# Patient Record
Sex: Female | Born: 1960
Health system: Southern US, Community
[De-identification: ages and names within clinical notes are randomized; demographics above are authoritative.]

## PROBLEM LIST (undated history)

## (undated) DIAGNOSIS — R112 Nausea with vomiting, unspecified: Secondary | ICD-10-CM

## (undated) DIAGNOSIS — R42 Dizziness and giddiness: Secondary | ICD-10-CM

## (undated) DIAGNOSIS — K649 Unspecified hemorrhoids: Secondary | ICD-10-CM

## (undated) DIAGNOSIS — I10 Essential (primary) hypertension: Secondary | ICD-10-CM

## (undated) DIAGNOSIS — L80 Vitiligo: Secondary | ICD-10-CM

## (undated) DIAGNOSIS — Z9889 Other specified postprocedural states: Secondary | ICD-10-CM

## (undated) DIAGNOSIS — F419 Anxiety disorder, unspecified: Secondary | ICD-10-CM

## (undated) DIAGNOSIS — E7800 Pure hypercholesterolemia, unspecified: Secondary | ICD-10-CM

## (undated) DIAGNOSIS — N920 Excessive and frequent menstruation with regular cycle: Secondary | ICD-10-CM

## (undated) HISTORY — DX: Vitiligo: L80

## (undated) HISTORY — PX: APPENDECTOMY: SHX54

## (undated) HISTORY — DX: Excessive and frequent menstruation with regular cycle: N92.0

## (undated) HISTORY — DX: Unspecified hemorrhoids: K64.9

## (undated) HISTORY — PX: TUBAL LIGATION: SHX77

## (undated) HISTORY — PX: ENDOMETRIAL ABLATION: SHX621

## (undated) HISTORY — PX: COLONOSCOPY: SHX174

---

## 2002-01-19 ENCOUNTER — Ambulatory Visit (HOSPITAL_COMMUNITY): Admission: RE | Admit: 2002-01-19 | Discharge: 2002-01-19 | Payer: Self-pay | Admitting: Obstetrics and Gynecology

## 2002-01-19 ENCOUNTER — Other Ambulatory Visit: Admission: RE | Admit: 2002-01-19 | Discharge: 2002-01-19 | Payer: Self-pay | Admitting: Obstetrics and Gynecology

## 2002-01-19 ENCOUNTER — Encounter: Payer: Self-pay | Admitting: Obstetrics and Gynecology

## 2002-01-26 ENCOUNTER — Ambulatory Visit (HOSPITAL_COMMUNITY): Admission: RE | Admit: 2002-01-26 | Discharge: 2002-01-26 | Payer: Self-pay | Admitting: Obstetrics and Gynecology

## 2003-12-26 ENCOUNTER — Ambulatory Visit (HOSPITAL_COMMUNITY): Admission: RE | Admit: 2003-12-26 | Discharge: 2003-12-26 | Payer: Self-pay | Admitting: Obstetrics & Gynecology

## 2004-06-25 ENCOUNTER — Ambulatory Visit (HOSPITAL_COMMUNITY): Admission: RE | Admit: 2004-06-25 | Discharge: 2004-06-25 | Payer: Self-pay | Admitting: Family Medicine

## 2009-08-03 ENCOUNTER — Encounter: Payer: Self-pay | Admitting: Emergency Medicine

## 2009-08-04 ENCOUNTER — Inpatient Hospital Stay (HOSPITAL_COMMUNITY): Admission: EM | Admit: 2009-08-04 | Discharge: 2009-08-05 | Payer: Self-pay | Admitting: Emergency Medicine

## 2010-03-24 ENCOUNTER — Ambulatory Visit: Payer: Self-pay | Admitting: Gastroenterology

## 2010-04-01 ENCOUNTER — Ambulatory Visit (HOSPITAL_COMMUNITY): Admission: RE | Admit: 2010-04-01 | Discharge: 2010-04-01 | Payer: Self-pay | Admitting: Gastroenterology

## 2010-04-01 ENCOUNTER — Ambulatory Visit: Payer: Self-pay | Admitting: Gastroenterology

## 2011-01-12 NOTE — Assessment & Plan Note (Signed)
Summary: FAMILY HISTORY OF COLON CA AND POLYPS   Visit Type:  Initial Consult Referring Provider:  [facility]ough Primary Care Provider:  Regino Schultze, M.D.  Chief Complaint:  TCS/hx hemorrhoids.  History of Present Illness: No bleeding, change in bowel habits, or constipation. Sister had advanced polyp. Mother had colon CA.  Preventive Screening-Counseling & Management  Alcohol-Tobacco     Smoking Status: quit  Current Medications (verified): 1)  Prinzide 10-12.5 Mg Tabs (Lisinopril-Hydrochlorothiazide) .... Take 1 Tablet By Mouth Once A Day 2)  Ibuprofen .... As Needed 3)  Zyrtec Hives Relief 10 Mg Tabs (Cetirizine Hcl) .... As Needed  Allergies (verified): No Known Drug Allergies  Past History:  Past Medical History: Allergies Hypertension  Past Surgical History: Tubal Ligation Endometrial Ablation  Family History: FH of Colon Cancer: mother, 39 yo Multiple Polyps: Sister: C. 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 Alcohol Use - yes: 1-2x/mo, beer.  Smoking Status:  quit  Review of Systems       Per HPI otherwise all systems negative.  Vital Signs:  Patient profile:   50 year old female Height:      62 inches Weight:      203 pounds BMI:     37.26 Temp:     98.9 degrees F oral Pulse rate:   72 / minute BP sitting:   130 / 80  (left arm) Cuff size:   regular  Vitals Entered By: Cloria Spring LPN (March 24, 2010 2:57 PM)  Physical Exam  General:  Well developed, well nourished, no acute distress. Head:  Normocephalic and atraumatic. Eyes:  PERRLA, no icterus. Mouth:  No deformity or lesions. Neck:  Supple; no masses. Lungs:  Clear throughout to auscultation. Heart:  Regular rate and rhythm; no murmurs. Abdomen:  Soft, nontender and nondistended.  Normal bowel  sounds. Extremities:  No cyanosis, edema or deformities noted. Neurologic:  Alert and  oriented x4;  grossly normal neurologically.  Impression & Recommendations:  Problem # 1:  NEOPLASM, MALIGNANT, COLON, FAMILY HX, MOTHER (ICD-V16.0) Assessment Unchanged and has a sister with multiple simple adenomas and she is above average risk for developing colon cancer. TCS next WED-SUPREP. OPV as needed. Will decide subsequent TCS after WED.  CC: PCP     Appended Document: Orders Update    Clinical Lists Changes  Orders: Added new Service order of Consultation Level I [phone]) - Signed

## 2011-01-12 NOTE — Letter (Signed)
Summary: TCS ORDER  TCS ORDER   Imported By: Ave Filter 03/24/2010 15:46:02  _____________________________________________________________________  External Attachment:    Type:   Image     Comment:   External Document

## 2011-03-20 LAB — HEPATIC FUNCTION PANEL
ALT: 16 U/L (ref 0–35)
ALT: 27 U/L (ref 0–35)
AST: 20 U/L (ref 0–37)
AST: 21 U/L (ref 0–37)
Albumin: 3.1 g/dL — ABNORMAL LOW (ref 3.5–5.2)
Albumin: 3.3 g/dL — ABNORMAL LOW (ref 3.5–5.2)
Alkaline Phosphatase: 50 U/L (ref 39–117)
Alkaline Phosphatase: 51 U/L (ref 39–117)
Bilirubin, Direct: 0.1 mg/dL (ref 0.0–0.3)
Bilirubin, Direct: 0.1 mg/dL (ref 0.0–0.3)
Indirect Bilirubin: 0.8 mg/dL (ref 0.3–0.9)
Indirect Bilirubin: 0.9 mg/dL (ref 0.3–0.9)
Total Bilirubin: 0.9 mg/dL (ref 0.3–1.2)
Total Bilirubin: 1 mg/dL (ref 0.3–1.2)
Total Protein: 5.8 g/dL — ABNORMAL LOW (ref 6.0–8.3)
Total Protein: 6.3 g/dL (ref 6.0–8.3)

## 2011-03-20 LAB — BASIC METABOLIC PANEL
BUN: 10 mg/dL (ref 6–23)
CO2: 20 mEq/L (ref 19–32)
Calcium: 9 mg/dL (ref 8.4–10.5)
Chloride: 107 mEq/L (ref 96–112)
Creatinine, Ser: 0.82 mg/dL (ref 0.4–1.2)
GFR calc Af Amer: >60 mL/min (ref >60)
GFR calc non Af Amer: >60 mL/min (ref >60)
Glucose, Bld: 133 mg/dL — ABNORMAL HIGH (ref 70–99)
Potassium: 3.8 mEq/L (ref 3.5–5.1)
Sodium: 136 mEq/L (ref 135–145)

## 2011-03-20 LAB — POCT CARDIAC MARKERS
CKMB, poc: 1 ng/mL (ref 1.0–8.0)
Myoglobin, poc: 43.8 ng/mL (ref 12–200)
Troponin i, poc: <0.05 ng/mL (ref 0.00–0.09)

## 2011-03-20 LAB — LIPID PANEL
Cholesterol: 209 mg/dL — ABNORMAL HIGH (ref 0–200)
HDL: 61 mg/dL (ref >39)
LDL Cholesterol: 139 mg/dL — ABNORMAL HIGH (ref 0–99)
Total CHOL/HDL Ratio: 3.4 RATIO
Triglycerides: 46 mg/dL (ref <150)
VLDL: 9 mg/dL (ref 0–40)

## 2011-03-20 LAB — DIFFERENTIAL
Basophils Absolute: 0.1 10*3/uL (ref 0.0–0.1)
Basophils Relative: 1 % (ref 0–1)
Eosinophils Absolute: 0.1 10*3/uL (ref 0.0–0.7)
Eosinophils Relative: 1 % (ref 0–5)
Lymphocytes Relative: 14 % (ref 12–46)
Lymphs Abs: 1.4 10*3/uL (ref 0.7–4.0)
Monocytes Absolute: 0.4 10*3/uL (ref 0.1–1.0)
Monocytes Relative: 4 % (ref 3–12)
Neutro Abs: 8 10*3/uL — ABNORMAL HIGH (ref 1.7–7.7)
Neutrophils Relative %: 80 % — ABNORMAL HIGH (ref 43–77)

## 2011-03-20 LAB — CBC
HCT: 39.4 % (ref 36.0–46.0)
Hemoglobin: 14.1 g/dL (ref 12.0–15.0)
MCHC: 35.6 g/dL (ref 30.0–36.0)
MCV: 85.3 fL (ref 78.0–100.0)
Platelets: 247 10*3/uL (ref 150–400)
RBC: 4.63 MIL/uL (ref 3.87–5.11)
RDW: 13.3 % (ref 11.5–15.5)
WBC: 10 10*3/uL (ref 4.0–10.5)

## 2011-03-20 LAB — URINALYSIS, ROUTINE W REFLEX MICROSCOPIC
Bilirubin Urine: NEGATIVE
Glucose, UA: NEGATIVE mg/dL
Hgb urine dipstick: NEGATIVE
Ketones, ur: 40 mg/dL — AB
Nitrite: NEGATIVE
Protein, ur: NEGATIVE mg/dL
Specific Gravity, Urine: 1.02 (ref 1.005–1.030)
Urobilinogen, UA: 0.2 mg/dL (ref 0.0–1.0)
pH: 7 (ref 5.0–8.0)

## 2011-03-20 LAB — T4, FREE: Free T4: 0.9 ng/dL (ref 0.80–1.80)

## 2011-03-20 LAB — HEMOGLOBIN AND HEMATOCRIT, BLOOD
HCT: 39.1 % (ref 36.0–46.0)
Hemoglobin: 13.6 g/dL (ref 12.0–15.0)

## 2011-03-20 LAB — TSH: TSH: 2.23 u[IU]/mL (ref 0.350–4.500)

## 2011-03-20 LAB — HOMOCYSTEINE: Homocysteine: 6.9 umol/L (ref 4.0–15.4)

## 2011-04-27 NOTE — Discharge Summary (Signed)
Emma Pittman, Emma Pittman              ACCOUNT NO.:  [id number]   MEDICAL RECORD NO.:  [id number]           PATIENT TYPE:   LOCATION:                                 FACILITY:   PHYSICIAN:  Beckey Rutter, MD  DATE OF BIRTH:  [birth year]   DATE OF ADMISSION:  DATE OF DISCHARGE:                               DISCHARGE SUMMARY   PRIMARY CARE PHYSICIAN:  Kirk Ruths, MD   CHIEF COMPLAINT:  Vertigo and nausea.   BRIEF HISTORY OF PRESENT ILLNESS:  A 50 year old pleasant Caucasian  female presented with above complaint.   HOSPITAL CONSULTATION:  The patient was seen in consultation by Dr. Newman Pies.   HOSPITAL PROCEDURES:  1. CT head without contrast on August 03, 2009; impression is no acute      intracranial abnormality.  2. MRI on August 03, 2009; impression is no acute or reversible      process, possible mild sinus inflammation.   DISCHARGE DIAGNOSES:  1. Right-sided labyrinthitis.  2. Obesity.  3. Remote history of dysfunctional uterine bleeding.  4. Status post tubal ligation.   DISCHARGE MEDICATIONS:  The patient will be discharged on:  1. Valium 2 mg p.o. q.8 h. p.r.n.  2. Doxycycline 100 mg p.o. b.i.d.  3. Meclizine 25 mg p.o. t.i.d.  4. Senokot 1 tablet p.o. at night, hold for loose stools.  5. Tylenol p.r.n. 650 mg p.r.n.      Beckey Rutter, MD  Electronically Signed     EME/MEDQ  D:  08/05/2009  T:  08/06/2009  Job:  [id number]   cc:   Kirk Ruths, M.D.

## 2011-04-27 NOTE — Consult Note (Signed)
Emma Pittman, Emma Pittman              ACCOUNT NO.:  [id number]   MEDICAL RECORD NO.:  [id number]          PATIENT TYPE:  INP   LOCATION:  4733                         FACILITY:  [facility]   PHYSICIAN:  Newman Pies, MD            DATE OF BIRTH:  07-10-1961   DATE OF CONSULTATION:  08/03/2009  DATE OF DISCHARGE:                                 CONSULTATION   CHIEF COMPLAINT:  Severe dizziness.   HISTORY OF PRESENT ILLNESS:  The patient is a 50 year old white female  who was transferred from the [facility] today for further  evaluation and treatment of her severe dizziness.  According to the  patient, she had an acute onset of dizziness earlier this morning.  She  describes the dizziness as true spinning vertigo sensation, with a  surrounding moving independent of her body.  The dizziness sensation is  constant.  It is mildly relieved by closing her eyes and staying in a  still position.  She also complains of a pressure sensation in the right  ear.  She denies any significant otalgia, otorrhea, or tinnitus.  She  has no previous history of otitis media, otitis external, otologic  surgery.  She did have an episode of upper respiratory infection  approximately 3 weeks ago.  She denies any previous history of migraine  headache or head trauma.  She also denies any recent barotrauma.  She  denies any significant loud noise exposure.   PAST MEDICAL HISTORY:  None.   PAST SURGICAL HISTORY:  Appendectomy and bilateral tubal ligation.   HOME MEDICATIONS:  None.   ALLERGIES:  No known drug allergies.   SOCIAL HISTORY:  Nonsmoker, no drug abuse, occasional drinker.   FAMILY HISTORY:  The patient denies any family history of otologic or  neurologic disorders.   RADIOLOGY:  The patient's head CT scan and MRI were all negative for any  significant intracranial abnormality.   PHYSICAL EXAMINATION:  VITAL SIGNS:  Temperature 98.6, blood pressure  153/96, pulse 96, respiration 17, oxygen  saturation 100% on room air.  GENERAL:  The patient is a well-nourished and well-developed 50 year old  white female in no acute distress.  She is alert and oriented x3.  HEENT:  Her pupils are equal, round, reactive to light.  Extraocular  motion is intact.  The patient is noted to have torsional nystagmus with  a rightward gaze.  No significant nystagmus is noted toward the left  side.  Palpation on percussion of the face reveals no sinus tenderness.  Examination of the ears shows normal auricles, external auditory canals,  and tympanic membranes bilaterally.  No middle ear effusion is noted.  Weber tuning fork test lateralizes to the left.  Air conduction is  greater than bone conduction bilaterally.  Nasal examination shows  normal mucosa, septum, and turbinates.  Oral cavity examination shows  normal lips, gums, tongue, oral cavity, and oropharyngeal mucosa.  Palpation on the neck reveals no lymphadenopathy or mass.  The trachea  is midline.  The thyroid is not significantly enlarged.  Cranial nerves  II through  XII are all grossly intact.  Cerebellar examination is  unremarkable.  The Dix-Hallpike is negative.  However, the patient  complains of dizziness at all positions.   IMPRESSION:  1. The patient's history and physical exam findings are suggestive of      right-sided labyrinthitis.  She appears to have mild hearing loss      on the right side.  2. The rest of her neurologic evaluation are unremarkable.  The head      CT and MRI scans are all negative.   RECOMMENDATIONS:  1. The patient will be admitted to the [facility] hospitalist service      for IV hydration.  2. Symptomatic control with Ativan and meclizine p.r.n.  3. The patient's symptoms will likely improve with time over the      coming weeks.  4. The patient will follow up in my office in approximately 1 week.      She is encouraged to call if any questions or concerns.      Newman Pies, MD  Electronically  Signed     ST/MEDQ  D:  08/03/2009  T:  08/04/2009  Job:  [id number]

## 2011-04-27 NOTE — H&P (Signed)
NAMEJESSLY, LEBECK NO.:  [id number]   MEDICAL RECORD NO.:  [id number]          PATIENT TYPE:  EMS   LOCATION:  MAJO                         FACILITY:  [facility]   PHYSICIAN:  Vania Rea, M.D. DATE OF BIRTH:  [birth year]   DATE OF ADMISSION:  08/03/2009  DATE OF DISCHARGE:                              HISTORY & PHYSICAL   PRIMARY CARE PHYSICIAN:  Dr. Karleen Hampshire.   CHIEF COMPLAINT:  Vertigo and nausea since this morning.   HISTORY OF PRESENT ILLNESS:  This is a 50 year old obese Caucasian lady  with no significant past medical problems who considered herself to be  in good health until this morning after breakfast when she had sudden  onset of vertigo with the place spinning, associated with nausea.  The  patient's symptoms were not relieved by rest and eventually she  presented to [facility] emergency room where she got some relief from  intravenous Valium, she says because it help her to lie still and she  was not moving.  The patient had a CT scan in the emergency room at  [facility] and was then transferred to [facility] emergency room to get  an MRI done.  The MRI was negative.  The patient continues to have  nausea triggered by head movement and photophobia and the hospitalist  service was called to assist with management.   The patient denies any fever, headaches, neck stiffness.  She denies any  cough, cold, chest pains or shortness of breath.  She denies any lower  extremity edema or pain.   PAST MEDICAL HISTORY:  1. Remote history of dysfunctional uterine bleeding.  2. Status post tubal ligation.   MEDICATIONS:  None.   ALLERGIES:  None.   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 She is married with one 39 year old daughter.   FAMILY HISTORY:  She does not drink or smoke.  She does drink heavily at  weekends occasionally. On this weekend she drank a 12-pack of beer.   FAMILY HISTORY:  Significant  for father who died. He did have coronary  artery disease, diabetes and hypertension.  Her mother is also deceased.  She had hypertension and diabetes and she had a sibling who is also  deceased with coronary artery disease, hypertension and probable stroke.   REVIEW OF SYSTEMS:  Other than noted above was unremarkable.   PHYSICAL EXAMINATION:  VITAL SIGNS:  She is 5 feet 2 inches in height,  but she denies knowing her weight. Temperature is 97.8, pulse 101,  respiratory rate 20, blood pressure 153/83, she is saturating 100% on  room air. She is in no pain.  GENERAL:  A young very pleasant but obviously obese Caucasian lady lying  in the stretcher, complaining of the lights in her eyes.  HEENT:  Her pupils are round equal and reactive.  Mucous membranes pink,  anicteric.  No cervical lymphadenopathy or thyromegaly.  No jugular  venous distention.  No carotid bruit.  CHEST:  Clear to auscultation bilaterally.  CARDIOVASCULAR:  Regular rhythm.  No murmur.  ABDOMEN:  Obese, soft and nontender.  EXTREMITIES:  Without edema.  She has no calf tenderness.  No bony joint  deformities.  Dorsalis pedis pulses 2+ equal bilaterally.  SKIN:  Warm and dry.  There is no ulceration.  NEUROLOGIC:  Notable for nystagmus with upward and rightward gaze. No  nystagmus with leftward gaze. Other than this cranial nerves II-XII are  grossly intact.  Motor and sensory and coordination systems as assessed  in the upper and lower extremities were completely normal.   LABORATORY DATA:  White count is 10.0, hemoglobin 14.1, platelets  247,000. She has 80% neutrophils.  Absolute neutrophil count is 80.  Sodium is 136, potassium 3.8, chloride 107, CO2 20, glucose 133, BUN 10,  creatinine 0.8, calcium 9.0. Cardiac enzymes:  Troponin was  undetectable, myoglobin was 43.   As noted above, CT scan was negative and a preliminary report from her  MRI was no acute findings.   ASSESSMENT:  1. Vertigo with right-sided  nystagmus at rest.  2. Obesity.   PLAN:  Will admit this lady with a presumed diagnosis of positional  vertigo but will get an ear, nose and throat consult for further  evaluation. Will treat with intravenous Valium, oral meclizine, and  intravenous hydration.   ADDENDUM:  The patient had the benefit of an ear, nose and throat  consult while in the emergency room and was diagnosed with a right-sided  labyrinthitis. Recommendations are to admit with the same treatment as  recommended above.  Other plans as per orders.      Vania Rea, M.D.  Electronically Signed     LC/MEDQ  D:  08/03/2009  T:  08/03/2009  Job:  [id number]   cc:   Pollyann Savoy, MD  Newman Pies, MD  Kirk Ruths, M.D.

## 2011-04-30 NOTE — Op Note (Signed)
[facility]  Patient:    Emma Pittman, Emma Pittman Visit Number: [id number] MRN: [id number]          Service Type: DSU Location: DAY Attending Physician:  Tilda Burrow Dictated by:   Christin Bach, M.D. Admit Date:  01/26/2002 Discharge Date: 01/26/2002                             Operative Report  PREOPERATIVE DIAGNOSIS:  Acute pelvic pain, suspected left hydrosalpinx.  POSTOPERATIVE DIAGNOSIS: 1. Acute pelvic pain, suspected left hydrosalpinx. 2. Small bowel adhesions, right lower quadrant.  PROCEDURE: 1. Diagnostic laparoscopy. 2. Left salpingectomy. 3. Lysis of small bowel adhesions.  SURGEON:  Christin Bach, M.D.  ASSISTANT:  _____ .  ANESTHESIA:  General.  COMPLICATIONS:  None.  FINDINGS: 1. Abnormal-appearing right epiglottis and right arytenoid with patient and    husband advised to seek ENT evaluation at a later date for these atypical    findings. 2. Filmy adhesions in the left adnexa consistent with hydrosalpinx. 4. Small bowel adhesions to the right adnexa further documented and excised.  DESCRIPTION OF PROCEDURE:  The patient was taken to the operating room, prepped and draped in the usual fashion for combined abdominal and vaginal procedure with legs supported in the _____ supports, Hulka tenaculum attached to the cervix, Foley catheter in place.  An infraumbilical vertical 1 cm skin incision performed with introduction of a 10 mm laparoscopic trocar. Introduction laparoscope revealed normal bowel and pelvic anatomy over the surfaces and no evidence of bleeding.  Suprapubic 12 mm trocar was placed without difficulty and then the third trocar placed in the left lower quadrant.  The patient had inspection of the pelvis and revealed a smooth, mobile uterus without abnormalities.  The right adnexa showed a visibly normal tube and ovary on that side.  The left adnexa was densely adherent to the sidewall with thin, filmy adhesions  as documented in laboratory photos.  The left tube was a hydrosalpinx.  Once the thin filmy adhesions were cut free and the left ovary looked grossly normal, we decided to take the left tube and leave the left ovary.  We then proceeded to use unipolar cautery as the surgical tool to cut beneath the tube and remove it from the left ovary.  The ovary was made mobile on all sides.  We then also used sharp dissection to free up the small bowel adhesions to the right lower quadrant.  Pelvis was irrigated.  The small bowel and adhesions were photo documented as being normal without any evidence of bowel injury.  We then proceeded to inflate the abdomen leaving in a generous 2 cc or so of saline solution followed by removal of laparoscopic equipment, 0 Vicryl closure of the fascial level with suprapubic and umbilical sites and staple closure of all incisions.  The patient tolerated the procedure well and went to the recovery room in good condition. Dictated by:   Christin Bach, M.D. Attending Physician:  Tilda Burrow DD:  03/05/02 TD:  03/07/02 Job: [id number]

## 2011-04-30 NOTE — H&P (Signed)
[facility]  Patient:    Emma Pittman, Emma Pittman Visit Number: [id number] MRN: [id number]          Service Type: OUT Location: RAD Attending Physician:  Tilda Burrow Dictated by:   Christin Bach, M.D. Proc. Date: 01/26/01 Admit Date:  01/19/2002 Discharge Date: 01/19/2002   CC:         [facility]   History and Physical  DATE OF BIRTH:  February 06, 1961  ADMITTING DIAGNOSES:  Left lower quadrant pain, suspected left hydrosalpinx, scheduled for diagnostic laparoscopy.  HISTORY OF PRESENT ILLNESS:  This 50 year old gravida 1, para 1 who is status post tubal ligation with LMP January 11, 2002 is admitted at this time after being seen twice in our office during February for left lower quadrant pain. When seen January 19, 2002 [name] reported a three week history of left-sided pain of gradual onset with persistent tenderness in the left lower quadrant not associated with bowel function.  She is having normal bowel activity and urinating without difficulty.  She has no fever.  No discharge.  Pain occurs with intercourse as well as other times.  There has been no masses or fullness noted.  Ultrasound has been ordered at [facility] which reveals normal ovaries on both right and left side with a tubular structure in the left adnexa most consistent with hydrosalpinx.  Examination shows tenderness in the adnexa.  Laparoscopy is planned to confirm the diagnosis of hydrosalpinx and to excise it if noted.  Patient is aware that conditions such as adhesions or bleeding might require removal of the left ovary as well. Patient requests that the right adnexa be inspected as well.  If salpingectomy is deemed as beneficial on the right side, then this has been discussed as a possibility and the patient wants this done if we suspect there is any possibility that the same problem could reoccur on the opposite side.  PAST MEDICAL HISTORY:  Notable  for an episode of left flank pain August 18, 2000 requiring overnight observation at which time the patient had a pelvic ultrasound which did not identify any hydrosalpinx at the time of that hospitalization.  At the time of that hospitalization for left lower quadrant pain she had some loose diarrhea that day without vomiting that improved dramatically over a four hour time frame.  PAST SURGICAL HISTORY:  Tubal ligation January 17, 1997.  ALLERGIES:  No known drug allergies.  SOCIAL HISTORY:  Habits:  Cigarettes:  None.  Alcohol:  Occasional social.  PHYSICAL EXAMINATION  VITAL SIGNS:  Height 5 feet 6 inches, weight 192 pounds, blood pressure 140/75.  GENERAL:  Healthy, moderately large framed Caucasian female who appears in mild to moderate discomfort from left lower quadrant.  HEENT:  Pupils equal, round, reactive.  NECK:  Supple.  CARDIOVASCULAR:  Unremarkable.  ABDOMEN:  Well healed surgical scar status post laparoscopy.  EXTREMITIES:  Grossly normal.  PELVIC:  External genitalia:  Normal.  Vaginal examination:  Normal secretions.  Cervix and uterus:  Normal to palpation.  Left adnexa:  2+/4+ tender without any appreciable masses.  PLAN:  Diagnostic laparoscopy, probable left salpingectomy and other procedures as deemed necessary to be performed January 26, 2002. Dictated by:   Christin Bach, M.D. Attending Physician:  Tilda Burrow DD:  01/23/02 TD:  01/23/02 Job: [id number]

## 2011-04-30 NOTE — Op Note (Signed)
Emma Pittman, Emma Pittman                        ACCOUNT NO.:  [id number]   MEDICAL RECORD NO.:  [id number]                   PATIENT TYPE:  AMB   LOCATION:  DAY                                  FACILITY:  [facility]   PHYSICIAN:  Lazaro Arms, M.D.                DATE OF BIRTH:  14-Mar-1961   DATE OF PROCEDURE:  12/26/2003  DATE OF DISCHARGE:                                 OPERATIVE REPORT   PREOPERATIVE DIAGNOSES:  1. Menometrorrhagia.  2. Dysmenorrhea.   POSTOPERATIVE DIAGNOSES:  1. Menometrorrhagia.  2. Dysmenorrhea.   PROCEDURE:  Hysteroscopy dilatation and curettage with endometrial ablation.   SURGEON:  Lazaro Arms, M.D.   ANESTHESIA:  Laryngeal mask anesthesia.   FINDINGS:  The patient had a normal endometrial cavity.  No polyps and no  abnormalities whatsoever.  It was quite small.  As well it only took 11 to  12 cc of fluid for appropriate pressure.   DESCRIPTION OF OPERATION:  The patient was taken to the operating room and  placed in the supine position where she underwent laryngeal mask anesthesia.  She was placed in the low lithotomy position and prepped and draped in the  usual sterile fashion; the bladder was drained.  A Graves speculum was  placed.  The cervix was grasped with a single-tooth tenaculum.  Marcaine  1/2% was injected as a paracervical block, 10 cc on either side.   The cervix was dilated serially to allow passage of the hysteroscope. The  hysteroscope was placed without difficulty and saline was used for  distending media.  There were no abnormalities of the endometrium. A  vigorous curettage was performed in all areas; a good uterine cry in all  areas.   The ThermaChoice endometrial ablation balloon was placed. It required 12 cc  of fluid to get an adequate pressure and it maintained pressure throughout  approximately 200 mmHg.  The patient tolerated the procedure well.  The  total therapy time was 9 minutes and 7 seconds. The balloon was  deflated;  all the fluid was accounted for.  She was awakened from anesthesia taken to  the recovery room in good stable condition. All counts were correct. There  was minimal blood loss.     ___________________________________________                                            Lazaro Arms, M.D.   Loraine Maple  D:  12/26/2003  T:  12/26/2003  Job:  [id number]

## 2011-04-30 NOTE — H&P (Signed)
NAME:  Emma Pittman, Emma Pittman                        ACCOUNT NO.:  [id number]   MEDICAL RECORD NO.:  [id number]                   PATIENT TYPE:  AMB   LOCATION:  DAY                                  FACILITY:  [facility]   PHYSICIAN:  Lazaro Arms, M.D.                DATE OF BIRTH:  1961/01/20   DATE OF ADMISSION:  DATE OF DISCHARGE:                                HISTORY & PHYSICAL   HISTORY OF PRESENT ILLNESS:  Emma Pittman is a 50 year old white female, gravida  1, para 1, status post tubal ligation and status post diagnostic lap with  left salpingectomy who has been having increasing difficulty with menstrual  periods now for quite some time.  The patient despite having a tubal  ligation has been tried on multiple birth control pills to try to control  her periods without success.  Additionally her cramping with her menstrual  periods is significant as well.  The patient has responded nicely to oral  Megace but that has been the only thing that has stopped her almost  continuous bleeding and pain.  As a result, she is admitted for  hysteroscopy, D&C and endometrial ablation.   PAST MEDICAL HISTORY:  Negative.   PAST SURGICAL HISTORY:  She had a tubal ligation, she also had a laparoscopy  with a left salpingectomy for a left hydrosalpinx and some small bowel  adhesions in 2003.   PAST OB:  Vaginal delivery.   REVIEW OF SYMPTOMS:  Otherwise negative.   PHYSICAL EXAMINATION:  HEENT:  Unremarkable.  Thyroid is normal.  LUNGS:  Clear.  HEART:  Regular rhythm without murmur, regurg or gallop.  BREASTS:  Without mass, discharge or skin changes.  ABDOMEN:  Benign.  PELVIC:  Normal.  EXTREMITIES:  Warm with no edema.  NEUROLOGIC:  Grossly intact.   IMPRESSION:  1. Continued dysfunctional uterine bleeding unresponsive to oral     contraceptives.  2. Dysmenorrhea.   PLAN:  The patient is admitted for hysteroscopy, D&C and endometrial  ablation.  She understands the risks, benefits,  indications and alternatives  and will proceed.    ___________________________________________                                         Lazaro Arms, M.D.   Loraine Maple  D:  12/25/2003  T:  12/25/2003  Job:  [id number]

## 2011-08-12 ENCOUNTER — Encounter (HOSPITAL_COMMUNITY): Payer: Self-pay

## 2011-08-12 ENCOUNTER — Encounter (HOSPITAL_COMMUNITY)
Admission: RE | Admit: 2011-08-12 | Discharge: 2011-08-12 | Disposition: A | Discharge status: Home / Self Care | Payer: 59 | Source: Ambulatory Visit | Attending: Ophthalmology | Admitting: Ophthalmology

## 2011-08-12 ENCOUNTER — Other Ambulatory Visit: Payer: Self-pay

## 2011-08-12 HISTORY — DX: Essential (primary) hypertension: I10

## 2011-08-12 HISTORY — DX: Dizziness and giddiness: R42

## 2011-08-12 HISTORY — DX: Nausea with vomiting, unspecified: R11.2

## 2011-08-12 HISTORY — DX: Pure hypercholesterolemia, unspecified: E78.00

## 2011-08-12 HISTORY — DX: Other specified postprocedural states: Z98.890

## 2011-08-12 LAB — CBC
HCT: 39.8 % (ref 36.0–46.0)
Hemoglobin: 13.5 g/dL (ref 12.0–15.0)
MCH: 29.2 pg (ref 26.0–34.0)
MCHC: 33.9 g/dL (ref 30.0–36.0)
MCV: 86.1 fL (ref 78.0–100.0)
Platelets: 257 10*3/uL (ref 150–400)
RBC: 4.62 MIL/uL (ref 3.87–5.11)
RDW: 12.8 % (ref 11.5–15.5)
WBC: 6.4 10*3/uL (ref 4.0–10.5)

## 2011-08-12 LAB — BASIC METABOLIC PANEL
BUN: 14 mg/dL (ref 6–23)
CO2: 28 mEq/L (ref 19–32)
Calcium: 9.6 mg/dL (ref 8.4–10.5)
Chloride: 100 mEq/L (ref 96–112)
Creatinine, Ser: 0.54 mg/dL (ref 0.50–1.10)
GFR calc Af Amer: >60 mL/min (ref >60)
GFR calc non Af Amer: >60 mL/min (ref >60)
Glucose, Bld: 93 mg/dL (ref 70–99)
Potassium: 3.5 mEq/L (ref 3.5–5.1)
Sodium: 137 mEq/L (ref 135–145)

## 2011-08-12 NOTE — Patient Instructions (Addendum)
22 Emma Pittman  08/12/2011   Your procedure is scheduled on:  08/19/2011  Report to [facility] at  130 PM.  Call this number if you have problems the morning of surgery: [phone]   Remember:   Do not eat food:After Midnight.  Do not drink clear liquids: After Midnight.  Take these medicines the morning of surgery with A SIP OF WATER: Lisinopril   Do not wear jewelry, make-up or nail polish.  Do not wear lotions, powders, or perfumes. You may wear deodorant.  Do not shave 48 hours prior to surgery.  Do not bring valuables to the hospital.  Contacts, dentures or bridgework may not be worn into surgery.  Leave suitcase in the car. After surgery it may be brought to your room.  For patients admitted to the hospital, checkout time is 11:00 AM the day of discharge.   Patients discharged the day of surgery will not be allowed to drive home.  Name and phone number of your driver: family  Special Instructions: N/A   Please read over the following fact sheets that you were given: Pain Booklet, Surgical Site Infection Prevention, Anesthesia Post-op Instructions and Care and Recovery After Surgery PATIENT INSTRUCTIONS POST-ANESTHESIA  IMMEDIATELY FOLLOWING SURGERY:  Do not drive or operate machinery for the first twenty four hours after surgery.  Do not make any important decisions for twenty four hours after surgery or while taking narcotic pain medications or sedatives.  If you develop intractable nausea and vomiting or a severe headache please notify your doctor immediately.  FOLLOW-UP:  Please make an appointment with your surgeon as instructed. You do not need to follow up with anesthesia unless specifically instructed to do so.  WOUND CARE INSTRUCTIONS (if applicable):  Keep a dry clean dressing on the anesthesia/puncture wound site if there is drainage.  Once the wound has quit draining you may leave it open to air.  Generally you should leave the bandage intact for twenty four hours  unless there is drainage.  If the epidural site drains for more than 36-48 hours please call the anesthesia department.  QUESTIONS?:  Please feel free to call your physician or the hospital operator if you have any questions, and they will be happy to assist you.     [facility] Anesthesia Department [address] [place] [phone]

## 2011-08-19 ENCOUNTER — Encounter (HOSPITAL_COMMUNITY): Payer: Self-pay | Admitting: *Deleted

## 2011-08-19 ENCOUNTER — Encounter (HOSPITAL_COMMUNITY)
Admission: RE | Disposition: A | Discharge status: Home / Self Care | Payer: Self-pay | Source: Ambulatory Visit | Attending: Ophthalmology

## 2011-08-19 ENCOUNTER — Encounter (HOSPITAL_COMMUNITY): Payer: Self-pay | Admitting: Anesthesiology

## 2011-08-19 ENCOUNTER — Ambulatory Visit (HOSPITAL_COMMUNITY)
Admission: RE | Admit: 2011-08-19 | Discharge: 2011-08-19 | Disposition: A | Discharge status: Home / Self Care | Payer: 59 | Source: Ambulatory Visit | Attending: Ophthalmology | Admitting: Ophthalmology

## 2011-08-19 ENCOUNTER — Ambulatory Visit (HOSPITAL_COMMUNITY): Payer: 59 | Admitting: Anesthesiology

## 2011-08-19 DIAGNOSIS — H25049 Posterior subcapsular polar age-related cataract, unspecified eye: Secondary | ICD-10-CM | POA: Insufficient documentation

## 2011-08-19 DIAGNOSIS — Z01810 Encounter for preprocedural cardiovascular examination: Secondary | ICD-10-CM | POA: Insufficient documentation

## 2011-08-19 DIAGNOSIS — Z01812 Encounter for preprocedural laboratory examination: Secondary | ICD-10-CM | POA: Insufficient documentation

## 2011-08-19 HISTORY — PX: CATARACT EXTRACTION W/PHACO: SHX586

## 2011-08-19 SURGERY — PHACOEMULSIFICATION, CATARACT, WITH IOL INSERTION
Anesthesia: Monitor Anesthesia Care | Site: Eye | Laterality: Right | Wound class: Clean

## 2011-08-19 MED ORDER — LIDOCAINE HCL (PF) 1 % IJ SOLN
INTRAOCULAR | Status: DC | PRN
  Administered 2011-08-19: 14:00:00 via OPHTHALMIC

## 2011-08-19 MED ORDER — TETRACAINE HCL 0.5 % OP SOLN
1.0000 [drp] | OPHTHALMIC | Status: AC
  Administered 2011-08-19 (×3): 1 [drp] via OPHTHALMIC

## 2011-08-19 MED ORDER — PHENYLEPHRINE HCL 2.5 % OP SOLN
1.0000 [drp] | OPHTHALMIC | Status: AC
  Administered 2011-08-19 (×3): 1 [drp] via OPHTHALMIC

## 2011-08-19 MED ORDER — LIDOCAINE HCL 3.5 % OP GEL
OPHTHALMIC | Status: AC
  Administered 2011-08-19: 1 via OPHTHALMIC
  Filled 2011-08-19: qty 5

## 2011-08-19 MED ORDER — NEOMYCIN-POLYMYXIN-DEXAMETH 0.1 % OP OINT
TOPICAL_OINTMENT | OPHTHALMIC | Status: DC | PRN
  Administered 2011-08-19: 1 via OPHTHALMIC

## 2011-08-19 MED ORDER — NEOMYCIN-POLYMYXIN-DEXAMETH 3.5-10000-0.1 OP OINT
TOPICAL_OINTMENT | OPHTHALMIC | Status: AC
  Filled 2011-08-19: qty 3.5

## 2011-08-19 MED ORDER — TETRACAINE HCL 0.5 % OP SOLN
OPHTHALMIC | Status: AC
  Administered 2011-08-19: 1 [drp] via OPHTHALMIC
  Filled 2011-08-19: qty 2

## 2011-08-19 MED ORDER — PROVISC 10 MG/ML IO SOLN
INTRAOCULAR | Status: DC | PRN
  Administered 2011-08-19: 8.5 mg via OPHTHALMIC

## 2011-08-19 MED ORDER — MIDAZOLAM HCL 2 MG/2ML IJ SOLN
1.0000 mg | INTRAMUSCULAR | Status: DC | PRN
  Administered 2011-08-19: 2 mg via INTRAVENOUS

## 2011-08-19 MED ORDER — LACTATED RINGERS IV SOLN
INTRAVENOUS | Status: DC
  Administered 2011-08-19: 1000 mL via INTRAVENOUS

## 2011-08-19 MED ORDER — MIDAZOLAM HCL 5 MG/5ML IJ SOLN
INTRAMUSCULAR | Status: AC
  Administered 2011-08-19: 2 mg via INTRAVENOUS
  Filled 2011-08-19: qty 5

## 2011-08-19 MED ORDER — CYCLOPENTOLATE-PHENYLEPHRINE 0.2-1 % OP SOLN
1.0000 [drp] | OPHTHALMIC | Status: AC
  Administered 2011-08-19 (×3): 1 [drp] via OPHTHALMIC

## 2011-08-19 MED ORDER — PHENYLEPHRINE HCL 2.5 % OP SOLN
OPHTHALMIC | Status: AC
  Administered 2011-08-19: 1 [drp] via OPHTHALMIC
  Filled 2011-08-19: qty 2

## 2011-08-19 MED ORDER — LIDOCAINE HCL 3.5 % OP GEL
1.0000 "application " | Freq: Once | OPHTHALMIC | Status: AC
  Administered 2011-08-19: 1 via OPHTHALMIC

## 2011-08-19 MED ORDER — LIDOCAINE HCL 3.5 % OP GEL
OPHTHALMIC | Status: DC | PRN
  Administered 2011-08-19: 1 via OPHTHALMIC

## 2011-08-19 MED ORDER — EPINEPHRINE HCL 1 MG/ML IJ SOLN
INTRAMUSCULAR | Status: AC
  Filled 2011-08-19: qty 1

## 2011-08-19 MED ORDER — LIDOCAINE HCL (PF) 1 % IJ SOLN
INTRAMUSCULAR | Status: AC
  Filled 2011-08-19: qty 2

## 2011-08-19 MED ORDER — EPINEPHRINE HCL 1 MG/ML IJ SOLN
INTRAOCULAR | Status: DC | PRN
  Administered 2011-08-19: 14:00:00

## 2011-08-19 MED ORDER — CYCLOPENTOLATE-PHENYLEPHRINE 0.2-1 % OP SOLN
OPHTHALMIC | Status: AC
  Administered 2011-08-19: 1 [drp] via OPHTHALMIC
  Filled 2011-08-19: qty 2

## 2011-08-19 MED ORDER — BSS IO SOLN
INTRAOCULAR | Status: DC | PRN
  Administered 2011-08-19: 15 mL via OPHTHALMIC

## 2011-08-19 MED ORDER — POVIDONE-IODINE 5 % OP SOLN
OPHTHALMIC | Status: DC | PRN
  Administered 2011-08-19: 1 via OPHTHALMIC

## 2011-08-19 SURGICAL SUPPLY — 34 items
CAPSULAR TENSION RING-AMO (OPHTHALMIC RELATED) IMPLANT
CLOTH BEACON ORANGE TIMEOUT ST (SAFETY) ×2 IMPLANT
DUOVISC SYSTEM (INTRAOCULAR LENS)
EYE SHIELD UNIVERSAL CLEAR (GAUZE/BANDAGES/DRESSINGS) ×2 IMPLANT
GLOVE BIO SURGEON STRL SZ 6.5 (GLOVE) IMPLANT
GLOVE BIOGEL PI IND STRL 6.5 (GLOVE) ×1 IMPLANT
GLOVE BIOGEL PI IND STRL 7.0 (GLOVE) IMPLANT
GLOVE BIOGEL PI IND STRL 7.5 (GLOVE) IMPLANT
GLOVE BIOGEL PI INDICATOR 6.5 (GLOVE) ×1
GLOVE BIOGEL PI INDICATOR 7.0 (GLOVE)
GLOVE BIOGEL PI INDICATOR 7.5 (GLOVE)
GLOVE ECLIPSE 6.5 STRL STRAW (GLOVE) IMPLANT
GLOVE ECLIPSE 7.0 STRL STRAW (GLOVE) IMPLANT
GLOVE ECLIPSE 7.5 STRL STRAW (GLOVE) IMPLANT
GLOVE EXAM NITRILE LRG STRL (GLOVE) ×2 IMPLANT
GLOVE EXAM NITRILE MD LF STRL (GLOVE) IMPLANT
GLOVE SKINSENSE NS SZ6.5 (GLOVE)
GLOVE SKINSENSE NS SZ7.0 (GLOVE)
GLOVE SKINSENSE STRL SZ6.5 (GLOVE) IMPLANT
GLOVE SKINSENSE STRL SZ7.0 (GLOVE) IMPLANT
KIT VITRECTOMY (OPHTHALMIC RELATED) IMPLANT
PAD ARMBOARD 7.5X6 YLW CONV (MISCELLANEOUS) ×2 IMPLANT
PROC W NO LENS (INTRAOCULAR LENS)
PROC W SPEC LENS (INTRAOCULAR LENS)
PROCESS W NO LENS (INTRAOCULAR LENS) IMPLANT
PROCESS W SPEC LENS (INTRAOCULAR LENS) IMPLANT
RING MALYGIN (MISCELLANEOUS) IMPLANT
SIGHTPATH CAT PROC W REG LENS (Ophthalmic Related) ×2 IMPLANT
SYR TB 1ML LL NO SAFETY (SYRINGE) ×2 IMPLANT
SYSTEM DUOVISC (INTRAOCULAR LENS) IMPLANT
TAPE SURG TRANSPARENT 2IN (GAUZE/BANDAGES/DRESSINGS) ×1 IMPLANT
TAPE TRANSPARENT 2IN (GAUZE/BANDAGES/DRESSINGS) ×1
VISCOELASTIC ADDITIONAL (OPHTHALMIC RELATED) IMPLANT
WATER STERILE IRR 250ML POUR (IV SOLUTION) ×2 IMPLANT

## 2011-08-19 NOTE — Transfer of Care (Signed)
Immediate Anesthesia Transfer of Care Note  Patient: Emma Pittman  Procedure(s) Performed:  CATARACT EXTRACTION PHACO AND INTRAOCULAR LENS PLACEMENT (IOC) - CDE:9.50  Patient Location: Shortstay  Anesthesia Type: MAC  Level of Consciousness: awake  Airway & Oxygen Therapy: Patient Spontanous Breathing   Post-op Assessment: Report given to PACU RN, Post -op Vital signs reviewed and stable and Patient moving all extremities  Post vital signs: Reviewed and stable  Complications: No apparent anesthesia complications

## 2011-08-19 NOTE — Brief Op Note (Signed)
Pre-Op Dx: Cataract OD Post-Op Dx: Cataract OD Surgeon: Latoria Dry Anesthesia: Topical with MAC Implant: Lenstec, Model Softec HD Blood Loss: None Specimen: None Complications: None 

## 2011-08-19 NOTE — OR Nursing (Signed)
Phase II [place]te 10

## 2011-08-19 NOTE — Op Note (Signed)
NAMEABIGAELLE, Emma Pittman              ACCOUNT NO.:  [id number]  MEDICAL RECORD NO.:  [id number]  LOCATION:  [facility]PO                          FACILITY:  [facility]  PHYSICIAN:  Susanne Greenhouse, MD       DATE OF BIRTH:  [birth year]  DATE OF PROCEDURE: DATE OF DISCHARGE:  08/19/2011                              OPERATIVE REPORT   PREOPERATIVE DIAGNOSIS:  Posterior subcapsular cataract, right eye, diagnosis code 366.14  POSTOPERATIVE DIAGNOSIS:  Posterior subcapsular cataract, right eye, diagnosis code 366.14.  PROCEDURE PERFORMED:  Phacoemulsification with intraocular lens implantation, right eye.  SURGEON:  Susanne Greenhouse, MD  DESCRIPTION OF OPERATION:  In the preoperative holding area, dilating drops and viscous lidocaine were placed into the left eye.  The patient was then brought to the operating room where she was prepped and draped. Beginning with a 75-blade, a paracentesis port was made at the surgeon's 2 o'clock position.  The anterior chamber was then filled with a 1% nonpreserved lidocaine solution.  This was followed by filling the anterior chamber with Provisc.  The 2.4-mm keratome blade was then used to make a clear corneal incision at the temporal limbus.  A bent cystotome needle and Utrata forceps were used to create a continuous tear capsulotomy.  Hydrodissection was performed with balanced salt solution on a fine cannula.  The lens nucleus was then removed using phacoemulsification in a quadrant cracking technique.  Residual cortex was removed with irrigation and aspiration.  The capsular bag and anterior chamber were then refilled with Provisc and a posterior chamber intraocular lens was placed into the capsular bag without difficulty using a lens injecting system.  The Provisc was removed from the capsular bag and anterior chamber with irrigation and aspiration. Stromal hydration of the main incision and paracentesis ports was performed with balanced salt solution on a fine  cannula.  The wounds were tested for leak, which were negative.  The patient tolerated the procedure well.  There were no operative complications and she was returned to the recovery area in satisfactory condition.  There were no surgical specimens.  PROSTHETIC DEVICE USED:  A Lenstec posterior chamber lens, model Softec HD, power of 21.5, serial number is [id number].          ______________________________ Susanne Greenhouse, MD    KEH/MEDQ  D:  08/19/2011  T:  08/19/2011  Job:  [id number]

## 2011-08-19 NOTE — Anesthesia Procedure Notes (Signed)
Procedure Name: MAC Date/Time: 08/19/2011 2:19 PM Performed by: Minerva Areola Pre-anesthesia Checklist: Suction available, Emergency Drugs available, Timeout performed, Patient identified and Patient being monitored Oxygen Delivery Method: Nasal Cannula

## 2011-08-19 NOTE — Anesthesia Preprocedure Evaluation (Addendum)
Anesthesia Evaluation  Name, MR# and DOB Patient awake  General Assessment Comment  Reviewed: Allergy & Precautions, H&P , NPO status , Patient's Chart, lab work & pertinent test results  History of Anesthesia Complications (+) PONV  Airway Mallampati: II      Dental  (+) Teeth Intact   Pulmonary      Cardiovascular hypertension, Pt. on medications Regular     Neuro/Psych Hx Vertigo    GI/Hepatic/Renal negative GI ROS            Endo/Other    Abdominal   Musculoskeletal   Hematology   Peds  Reproductive/Obstetrics    Anesthesia Other Findings             Anesthesia Physical Anesthesia Plan  ASA: II  Anesthesia Plan: MAC   Post-op Pain Management:    Induction: Intravenous  Airway Management Planned: Nasal Cannula  Additional Equipment:   Intra-op Plan:   Post-operative Plan:   Informed Consent: I have reviewed the patients History and Physical, chart, labs and discussed the procedure including the risks, benefits and alternatives for the proposed anesthesia with the patient or authorized representative who has indicated his/her understanding and acceptance.     Plan Discussed with:   Anesthesia Plan Comments:         Anesthesia Quick Evaluation

## 2011-08-19 NOTE — Anesthesia Postprocedure Evaluation (Signed)
  Anesthesia Post-op Note  Patient: Emma Pittman  Procedure(s) Performed:  CATARACT EXTRACTION PHACO AND INTRAOCULAR LENS PLACEMENT (IOC) - CDE:9.50  Patient Location:  Short Stay  Anesthesia Type: MAC  Level of Consciousness: awake  Airway and Oxygen Therapy: Patient Spontanous Breathing  Post-op Pain: none  Post-op Assessment: Post-op Vital signs reviewed, Patient's Cardiovascular Status Stable, Respiratory Function Stable, Patent Airway, No signs of Nausea or vomiting and Pain level controlled  Post-op Vital Signs: Reviewed and stable  Complications: No apparent anesthesia complications

## 2011-08-19 NOTE — H&P (Signed)
I have evaluated the patient preoperatively, and have identified no interval changes in medical condition and plan of care since the history and physical of record 

## 2011-08-20 MED ORDER — LACTATED RINGERS IV SOLN: INTRAVENOUS | Status: DC

## 2011-08-20 MED ORDER — FENTANYL CITRATE 0.05 MG/ML IJ SOLN: 25.0000 ug | INTRAMUSCULAR | Status: AC | PRN

## 2011-08-20 MED ORDER — ONDANSETRON HCL 4 MG/2ML IJ SOLN: 4.0000 mg | Freq: Once | INTRAMUSCULAR | Status: AC | PRN

## 2011-08-25 ENCOUNTER — Encounter (HOSPITAL_COMMUNITY): Payer: Self-pay | Admitting: Ophthalmology

## 2011-08-30 ENCOUNTER — Encounter (HOSPITAL_COMMUNITY): Admission: RE | Admit: 2011-08-30 | Discharge: 2011-08-30 | Payer: 59 | Source: Ambulatory Visit

## 2011-08-30 ENCOUNTER — Encounter (HOSPITAL_COMMUNITY): Payer: Self-pay

## 2011-09-06 ENCOUNTER — Encounter (HOSPITAL_COMMUNITY): Payer: Self-pay | Admitting: *Deleted

## 2011-09-06 ENCOUNTER — Ambulatory Visit (HOSPITAL_COMMUNITY): Payer: 59 | Admitting: Anesthesiology

## 2011-09-06 ENCOUNTER — Encounter (HOSPITAL_COMMUNITY): Payer: Self-pay | Admitting: Ophthalmology

## 2011-09-06 ENCOUNTER — Encounter (HOSPITAL_COMMUNITY): Payer: Self-pay | Admitting: Anesthesiology

## 2011-09-06 ENCOUNTER — Encounter (HOSPITAL_COMMUNITY)
Admission: RE | Disposition: A | Discharge status: Home / Self Care | Payer: Self-pay | Source: Ambulatory Visit | Attending: Ophthalmology

## 2011-09-06 ENCOUNTER — Ambulatory Visit (HOSPITAL_COMMUNITY)
Admission: RE | Admit: 2011-09-06 | Discharge: 2011-09-06 | Disposition: A | Discharge status: Home / Self Care | Payer: 59 | Source: Ambulatory Visit | Attending: Ophthalmology | Admitting: Ophthalmology

## 2011-09-06 DIAGNOSIS — Z79899 Other long term (current) drug therapy: Secondary | ICD-10-CM | POA: Insufficient documentation

## 2011-09-06 DIAGNOSIS — H25049 Posterior subcapsular polar age-related cataract, unspecified eye: Secondary | ICD-10-CM | POA: Insufficient documentation

## 2011-09-06 DIAGNOSIS — I10 Essential (primary) hypertension: Secondary | ICD-10-CM | POA: Insufficient documentation

## 2011-09-06 HISTORY — PX: CATARACT EXTRACTION W/PHACO: SHX586

## 2011-09-06 SURGERY — PHACOEMULSIFICATION, CATARACT, WITH IOL INSERTION
Anesthesia: Monitor Anesthesia Care | Site: Eye | Laterality: Left | Wound class: Clean

## 2011-09-06 MED ORDER — LIDOCAINE HCL 3.5 % OP GEL
1.0000 "application " | Freq: Once | OPHTHALMIC | Status: AC
  Administered 2011-09-06: 1 via OPHTHALMIC

## 2011-09-06 MED ORDER — LIDOCAINE HCL (PF) 1 % IJ SOLN
INTRAMUSCULAR | Status: AC
  Filled 2011-09-06: qty 2

## 2011-09-06 MED ORDER — EPINEPHRINE HCL 1 MG/ML IJ SOLN
INTRAOCULAR | Status: DC | PRN
  Administered 2011-09-06: 09:00:00

## 2011-09-06 MED ORDER — CYCLOPENTOLATE-PHENYLEPHRINE 0.2-1 % OP SOLN
1.0000 [drp] | OPHTHALMIC | Status: AC
  Administered 2011-09-06 (×3): 1 [drp] via OPHTHALMIC

## 2011-09-06 MED ORDER — BSS IO SOLN
INTRAOCULAR | Status: DC | PRN
  Administered 2011-09-06: 15 mL via OPHTHALMIC

## 2011-09-06 MED ORDER — EPINEPHRINE HCL 1 MG/ML IJ SOLN
INTRAMUSCULAR | Status: AC
  Filled 2011-09-06: qty 1

## 2011-09-06 MED ORDER — ONDANSETRON HCL 4 MG/2ML IJ SOLN
INTRAMUSCULAR | Status: AC
  Administered 2011-09-06: 4 mg via INTRAVENOUS
  Filled 2011-09-06: qty 2

## 2011-09-06 MED ORDER — LIDOCAINE HCL (PF) 1 % IJ SOLN
INTRAMUSCULAR | Status: DC | PRN
Start: 2011-09-06 — End: 2011-09-06
  Administered 2011-09-06: .3 mL

## 2011-09-06 MED ORDER — PHENYLEPHRINE HCL 2.5 % OP SOLN
OPHTHALMIC | Status: AC
  Administered 2011-09-06: 1 [drp] via OPHTHALMIC
  Filled 2011-09-06: qty 2

## 2011-09-06 MED ORDER — LACTATED RINGERS IV SOLN
INTRAVENOUS | Status: DC | PRN
  Administered 2011-09-06: 09:00:00 via INTRAVENOUS

## 2011-09-06 MED ORDER — LACTATED RINGERS IV SOLN
INTRAVENOUS | Status: DC
  Administered 2011-09-06: 500 mL via INTRAVENOUS

## 2011-09-06 MED ORDER — MIDAZOLAM HCL 2 MG/2ML IJ SOLN
1.0000 mg | INTRAMUSCULAR | Status: DC | PRN
  Administered 2011-09-06: 2 mg via INTRAVENOUS

## 2011-09-06 MED ORDER — TETRACAINE HCL 0.5 % OP SOLN
1.0000 [drp] | OPHTHALMIC | Status: AC
  Administered 2011-09-06 (×3): 1 [drp] via OPHTHALMIC

## 2011-09-06 MED ORDER — CYCLOPENTOLATE-PHENYLEPHRINE 0.2-1 % OP SOLN
OPHTHALMIC | Status: AC
  Administered 2011-09-06: 1 [drp] via OPHTHALMIC
  Filled 2011-09-06: qty 2

## 2011-09-06 MED ORDER — TETRACAINE HCL 0.5 % OP SOLN
OPHTHALMIC | Status: AC
  Filled 2011-09-06: qty 2

## 2011-09-06 MED ORDER — NEOMYCIN-POLYMYXIN-DEXAMETH 0.1 % OP OINT
TOPICAL_OINTMENT | OPHTHALMIC | Status: DC | PRN
  Administered 2011-09-06: 1 via OPHTHALMIC

## 2011-09-06 MED ORDER — ONDANSETRON HCL 4 MG/2ML IJ SOLN
4.0000 mg | Freq: Once | INTRAMUSCULAR | Status: AC
  Administered 2011-09-06: 4 mg via INTRAVENOUS

## 2011-09-06 MED ORDER — MIDAZOLAM HCL 2 MG/2ML IJ SOLN
INTRAMUSCULAR | Status: AC
  Administered 2011-09-06: 2 mg via INTRAVENOUS
  Filled 2011-09-06: qty 2

## 2011-09-06 MED ORDER — LIDOCAINE HCL 3.5 % OP GEL
OPHTHALMIC | Status: AC
  Administered 2011-09-06: 1 via OPHTHALMIC
  Filled 2011-09-06: qty 5

## 2011-09-06 MED ORDER — LIDOCAINE 3.5 % OP GEL OPTIME - NO CHARGE
OPHTHALMIC | Status: DC | PRN
  Administered 2011-09-06: 1 [drp] via OPHTHALMIC

## 2011-09-06 MED ORDER — PROVISC 10 MG/ML IO SOLN
INTRAOCULAR | Status: DC | PRN
  Administered 2011-09-06: 8.5 mg via INTRAOCULAR

## 2011-09-06 MED ORDER — POVIDONE-IODINE 5 % OP SOLN
OPHTHALMIC | Status: DC | PRN
Start: 2011-09-06 — End: 2011-09-06
  Administered 2011-09-06: 1 via OPHTHALMIC

## 2011-09-06 MED ORDER — PHENYLEPHRINE HCL 2.5 % OP SOLN
1.0000 [drp] | OPHTHALMIC | Status: AC
  Administered 2011-09-06 (×3): 1 [drp] via OPHTHALMIC

## 2011-09-06 MED ORDER — NEOMYCIN-POLYMYXIN-DEXAMETH 3.5-10000-0.1 OP OINT
TOPICAL_OINTMENT | OPHTHALMIC | Status: AC
  Filled 2011-09-06: qty 3.5

## 2011-09-06 SURGICAL SUPPLY — 35 items
CAPSULAR TENSION RING-AMO (OPHTHALMIC RELATED) IMPLANT
CLOTH BEACON ORANGE TIMEOUT ST (SAFETY) ×2 IMPLANT
DUOVISC SYSTEM (INTRAOCULAR LENS)
ETHILON 10-0 ×2 IMPLANT
EYE SHIELD UNIVERSAL CLEAR (GAUZE/BANDAGES/DRESSINGS) ×2 IMPLANT
GLOVE BIO SURGEON STRL SZ 6.5 (GLOVE) IMPLANT
GLOVE BIOGEL PI IND STRL 6.5 (GLOVE) ×1 IMPLANT
GLOVE BIOGEL PI IND STRL 7.0 (GLOVE) IMPLANT
GLOVE BIOGEL PI IND STRL 7.5 (GLOVE) IMPLANT
GLOVE BIOGEL PI INDICATOR 6.5 (GLOVE) ×1
GLOVE BIOGEL PI INDICATOR 7.0 (GLOVE)
GLOVE BIOGEL PI INDICATOR 7.5 (GLOVE)
GLOVE ECLIPSE 6.5 STRL STRAW (GLOVE) IMPLANT
GLOVE ECLIPSE 7.0 STRL STRAW (GLOVE) IMPLANT
GLOVE ECLIPSE 7.5 STRL STRAW (GLOVE) IMPLANT
GLOVE EXAM NITRILE LRG STRL (GLOVE) IMPLANT
GLOVE EXAM NITRILE MD LF STRL (GLOVE) ×2 IMPLANT
GLOVE SKINSENSE NS SZ6.5 (GLOVE)
GLOVE SKINSENSE NS SZ7.0 (GLOVE)
GLOVE SKINSENSE STRL SZ6.5 (GLOVE) IMPLANT
GLOVE SKINSENSE STRL SZ7.0 (GLOVE) IMPLANT
KIT VITRECTOMY (OPHTHALMIC RELATED) IMPLANT
PAD ARMBOARD 7.5X6 YLW CONV (MISCELLANEOUS) ×2 IMPLANT
PROC W NO LENS (INTRAOCULAR LENS)
PROC W SPEC LENS (INTRAOCULAR LENS)
PROCESS W NO LENS (INTRAOCULAR LENS) IMPLANT
PROCESS W SPEC LENS (INTRAOCULAR LENS) IMPLANT
RING MALYGIN (MISCELLANEOUS) IMPLANT
SIGHTPATH CAT PROC W REG LENS (Ophthalmic Related) ×4 IMPLANT
SYR TB 1ML LL NO SAFETY (SYRINGE) ×2 IMPLANT
SYSTEM DUOVISC (INTRAOCULAR LENS) IMPLANT
TAPE SURG TRANSPORE 1 IN (GAUZE/BANDAGES/DRESSINGS) ×1 IMPLANT
TAPE SURGICAL TRANSPORE 1 IN (GAUZE/BANDAGES/DRESSINGS) ×1
VISCOELASTIC ADDITIONAL (OPHTHALMIC RELATED) IMPLANT
WATER STERILE IRR 250ML POUR (IV SOLUTION) ×2 IMPLANT

## 2011-09-06 NOTE — Op Note (Signed)
Emma Pittman, Emma Pittman              ACCOUNT NO.:  [id number]  MEDICAL RECORD NO.:  [id number]  LOCATION:  [facility]                          FACILITY:  [facility]  PHYSICIAN:  Susanne Greenhouse, MD       DATE OF BIRTH:  July 05, 1961  DATE OF PROCEDURE:  09/06/2011 DATE OF DISCHARGE:                              OPERATIVE REPORT   PREOPERATIVE DIAGNOSIS:  Posterior subcapsular cataract, left eye, diagnosis code 366.14.  POSTOPERATIVE DIAGNOSIS:  Posterior subcapsular cataract, left eye, diagnosis code 366.14.  SURGEON:  Bonne Dolores. Esperansa Sarabia, MD  ANESTHESIA:  Topical with monitored anesthesia care.  DESCRIPTION OF OPERATION:  In the preoperative holding area, dilating drops and viscous lidocaine were placed into the left eye.  The patient was then prepped and draped.  Beginning with a #75 blade, a paracentesis port was made at the surgeon's 2 o'clock position.  The anterior chamber was filled with a 1% nonpreserved lidocaine solution.  This was followed by filling the anterior chamber with Provisc.  A 2.4 mm keratome blade was then used to make a clear corneal incision at the temporal limbus. A bent cystotome needle was used to create a continuous tear capsulotomy.  Hydrodissection was performed with balanced salt solution on a fine cannula prolapsing the lens nucleus out of the capsular bag which was then removed with the phacoemulsification handpiece using mostly vacuum.  Residual cortex was removed with irrigation and aspiration.  The capsular bag was polished, however, a cloudy remnant was left on the posterior capsule.  A intraocular lens was then placed into the anterior chamber.  After unfolding, it was noted that the leading haptic was amputated, so the implant was removed.  This was done by dividing it into 2 pieces with Vannas scissors after widening the incision to approximately 3.0 mm.  After the damaged implant was removed, a second implant was placed into the capsular bag  without difficulty.  The residual Provisc was removed from the capsular bag and anterior chamber with irrigation and aspiration.  For security, a single 10-0 nylon suture was placed into the incision and was rotated burying the knots.  The wounds were tested for leak which were negative.  The patient tolerated the procedure well.  OPERATIVE COMPLICATIONS:  Damaged implant which required explantation.  PROSTHETIC DEVICE USED:  Lenstec posterior chamber lens, model Softec HD, power of 22.0, serial number is [id number].  SPECIMENS:  There were no surgical specimens.          ______________________________ Susanne Greenhouse, MD     KEH/MEDQ  D:  09/06/2011  T:  09/06/2011  Job:  [id number]

## 2011-09-06 NOTE — Anesthesia Postprocedure Evaluation (Signed)
  Anesthesia Post-op Note  Patient: Emma Pittman  Procedure(s) Performed:  CATARACT EXTRACTION PHACO AND INTRAOCULAR LENS PLACEMENT (IOC) - CDE: 5.25  Patient Location: PACU and Short Stay  Anesthesia Type: MAC  Level of Consciousness: awake, alert , oriented and patient cooperative  Airway and Oxygen Therapy: Patient Spontanous Breathing  Post-op Pain: none  Post-op Assessment: Post-op Vital signs reviewed, Patient's Cardiovascular Status Stable, Respiratory Function Stable, Patent Airway and No signs of Nausea or vomiting  Post-op Vital Signs: Reviewed and stable  Complications: No apparent anesthesia complications

## 2011-09-06 NOTE — Transfer of Care (Signed)
Immediate Anesthesia Transfer of Care Note  Patient: Emma Pittman  Procedure(s) Performed:  CATARACT EXTRACTION PHACO AND INTRAOCULAR LENS PLACEMENT (IOC) - CDE: 5.25  Patient Location: PACU and Short Stay  Anesthesia Type: MAC  Level of Consciousness: awake, alert , oriented and patient cooperative  Airway & Oxygen Therapy: Patient Spontanous Breathing  Post-op Assessment: Report given to PACU RN, Post -op Vital signs reviewed and stable and Patient moving all extremities  Post vital signs: Reviewed and stable  Complications: No apparent anesthesia complications

## 2011-09-06 NOTE — Brief Op Note (Signed)
Pre-Op Dx: Cataract OS Post-Op Dx: Cataract OS Surgeon: Jariah Tarkowski Anesthesia: Topical with MAC Implant: Lenstec, Model Softec HD Specimen: None Complications: None 

## 2011-09-06 NOTE — H&P (Signed)
I have evaluated the patient preoperatively, and have identified no interval changes in medical condition and plan of care since the history and physical of record 

## 2011-09-06 NOTE — Anesthesia Preprocedure Evaluation (Signed)
Anesthesia Evaluation  Name, MR# and DOB Patient awake  General Assessment Comment  Reviewed: Allergy & Precautions, H&P , NPO status , Patient's Chart, lab work & pertinent test results  History of Anesthesia Complications (+) PONV  Airway Mallampati: II      Dental  (+) Teeth Intact   Pulmonary      Cardiovascular hypertension, Pt. on medications Regular     Neuro/Psych Hx Vertigo    GI/Hepatic/Renal negative GI ROS            Endo/Other    Abdominal   Musculoskeletal   Hematology   Peds  Reproductive/Obstetrics    Anesthesia Other Findings             Anesthesia Physical Anesthesia Plan  ASA: II  Anesthesia Plan: MAC   Post-op Pain Management:    Induction: Intravenous  Airway Management Planned: Nasal Cannula  Additional Equipment:   Intra-op Plan:   Post-operative Plan:   Informed Consent: I have reviewed the patients History and Physical, chart, labs and discussed the procedure including the risks, benefits and alternatives for the proposed anesthesia with the patient or authorized representative who has indicated his/her understanding and acceptance.     Plan Discussed with:   Anesthesia Plan Comments:         Anesthesia Quick Evaluation  

## 2011-09-09 ENCOUNTER — Encounter (HOSPITAL_COMMUNITY): Payer: Self-pay | Admitting: Ophthalmology

## 2013-03-06 ENCOUNTER — Encounter: Payer: Self-pay | Admitting: Gastroenterology

## 2013-08-04 ENCOUNTER — Emergency Department (HOSPITAL_COMMUNITY)
Admission: EM | Admit: 2013-08-04 | Discharge: 2013-08-04 | Disposition: A | Discharge status: Home / Self Care | Payer: 59 | Attending: Emergency Medicine | Admitting: Emergency Medicine

## 2013-08-04 ENCOUNTER — Encounter (HOSPITAL_COMMUNITY): Payer: Self-pay

## 2013-08-04 ENCOUNTER — Emergency Department (HOSPITAL_COMMUNITY): Payer: 59

## 2013-08-04 DIAGNOSIS — Z87891 Personal history of nicotine dependence: Secondary | ICD-10-CM | POA: Insufficient documentation

## 2013-08-04 DIAGNOSIS — E7800 Pure hypercholesterolemia, unspecified: Secondary | ICD-10-CM | POA: Insufficient documentation

## 2013-08-04 DIAGNOSIS — Z79899 Other long term (current) drug therapy: Secondary | ICD-10-CM | POA: Insufficient documentation

## 2013-08-04 DIAGNOSIS — I10 Essential (primary) hypertension: Secondary | ICD-10-CM | POA: Insufficient documentation

## 2013-08-04 DIAGNOSIS — R0789 Other chest pain: Secondary | ICD-10-CM

## 2013-08-04 DIAGNOSIS — R071 Chest pain on breathing: Secondary | ICD-10-CM | POA: Insufficient documentation

## 2013-08-04 MED ORDER — OXYCODONE-ACETAMINOPHEN 5-325 MG PO TABS
2.0000 | ORAL_TABLET | Freq: Once | ORAL | Status: AC
  Administered 2013-08-04: 2 via ORAL
  Filled 2013-08-04: qty 2

## 2013-08-04 NOTE — ED Notes (Signed)
Pt c/o pain in left ribs around to back and left shoulder since Monday.  Denie injury, reports worked in the yard Sat and Sun.  Pt says pain is worse with movement and deep breaths.

## 2013-08-04 NOTE — ED Notes (Signed)
Patient with no complaints at this time. Respirations even and unlabored. Skin warm/dry. Discharge instructions reviewed with patient at this time. Patient given opportunity to voice concerns/ask questions. Patient discharged at this time and left Emergency Department with steady gait.   

## 2013-08-04 NOTE — ED Provider Notes (Addendum)
CSN: [id number]     Arrival date & time 08/04/13  1323 History  This chart was scribed for Hilario Quarry, MD by Ardelia Mems, ED Scribe. This patient was seen in room APA18/APA18 and the patient's care was started at 1:31 PM.    Chief Complaint  Patient presents with  . rib pain     The history is provided by the patient. No language interpreter was used.    HPI Comments: Emma Pittman is a 52 y.o. female who presents to the Emergency Department complaining of gradual onset, gradually worsening,  constant, severe "8.5/10" rib pain located on her back and left shoulder area onset 5 days ago. She relates this pain to exeriton during yard work She describes her pain as dull with sharp episodes. She states that her pain is worsened with movement and deep inspiration.  Denies SOB, nausea, vomiting She states that she has taken 200 mg, 4 times a day 4 and 5 days ago without relief She denies smoking and is an occaisonal alcohol user.  Takes HTN and cholesterol medications. She denies any history of blood clots in her legs or lungs. Long lng travel or flights  PCP- Dr. Karleen Hampshire   Past Medical History  Diagnosis Date  . PONV (postoperative nausea and vomiting)   . Hypertension   . Hypercholesteremia   . Vertigo    Past Surgical History  Procedure Laterality Date  . Endometrial ablation  8 yrs ago-eure    aph  . Tubal ligation  17 yrs & 10 yrs     aph-Dr Ferguson:also had dye studies of tubes with opening of one tube  . Appendectomy  age 89  . Cataract extraction w/phaco  08/19/2011    Procedure: CATARACT EXTRACTION PHACO AND INTRAOCULAR LENS PLACEMENT (IOC);  Surgeon: Gemma Payor;  Location: [facility];  Service: Ophthalmology;  Laterality: Right;  CDE:9.50  . Cataract extraction w/phaco  09/06/2011    Procedure: CATARACT EXTRACTION PHACO AND INTRAOCULAR LENS PLACEMENT (IOC);  Surgeon: Gemma Payor;  Location: [facility];  Service: Ophthalmology;  Laterality: Left;  CDE: 5.25    Family History  Problem Relation Age of Onset  . Anesthesia problems Neg Hx   . Hypotension Neg Hx   . Malignant hyperthermia Neg Hx   . Pseudochol deficiency Neg Hx    History  Substance Use Topics  . Smoking status: Former Smoker -- 0.50 packs/day for 4 years    Types: Cigarettes    Quit date: 08/12/1991  . Smokeless tobacco: Not on file  . Alcohol Use:      Comment: occassional   OB History   Grav Para Term Preterm Abortions TAB SAB Ect Mult Living                 Review of Systems  All other systems reviewed and are negative.    Allergies  Review of patient's allergies indicates no known allergies.  Home Medications   Current Outpatient Rx  Name  Route  Sig  Dispense  Refill  . acetaminophen (TYLENOL) 325 MG tablet   Oral   Take 325 mg by mouth every 6 (six) hours as needed. For pain          . atorvastatin (LIPITOR) 10 MG tablet   Oral   Take 10 mg by mouth daily.          Marland Kitchen lisinopril-hydrochlorothiazide (PRINZIDE,ZESTORETIC) 10-12.5 MG per tablet   Oral   Take 1 tablet by mouth daily.  Triage Vitals: BP 128/77  Pulse 104  Temp(Src) 98.8 F (37.1 C) (Oral)  Resp 18  SpO2 96%  Physical Exam  Nursing note and vitals reviewed. Constitutional: She is oriented to person, place, and time. She appears well-developed and well-nourished.  HENT:  Head: Normocephalic and atraumatic.  Eyes: Conjunctivae and EOM are normal.  Neck: Normal range of motion. Neck supple. No tracheal deviation present.  Cardiovascular: Normal rate, regular rhythm and normal heart sounds.   HR is 78 and regular.  Pulmonary/Chest: Effort normal and breath sounds normal. No respiratory distress.  Lungs are clear to auscultation. Tenderness to palpation over mid left anterolateral rib cage. No signs of trauma to the rib cage or chest.  Abdominal: Soft. Bowel sounds are normal. There is no tenderness.  Musculoskeletal: Normal range of motion. She exhibits no  tenderness.  Back with no signs of trauma. No swelling, tenderness or cords in calves.  Neurological: She is alert and oriented to person, place, and time.  All distal pulses are equal and intact.  Skin: Skin is warm and dry. No rash noted.  Psychiatric: She has a normal mood and affect.    ED Course   Procedures (including critical care time)  DIAGNOSTIC STUDIES: Oxygen Saturation is 96% on RA, normal by my interpretation.    COORDINATION OF CARE: 1:38 PM- Pt advised of plan for diagnostic radiology and pt agrees.     Labs Reviewed - No data to display  No results found.  No diagnosis found.  MDM   Date: 08/04/2013  Rate: 94  Rhythm: normal sinus rhythm  QRS Axis: normal  Intervals: normal  ST/T Wave abnormalities: normal  Conduction Disutrbances: none  Narrative Interpretation: unremarkable      Narrative Interpretation:   Old EKG Reviewed: unchanged    Left reproducible chest pain- initial vitals with increased hr reported at triage but normal rate on my exam. NO risk factors for pe perc negative.   Hilario Quarry, MD 08/04/13 1421  I personally performed the services described in this documentation, which was scribed in my presence. The recorded information has been reviewed and considered.   Hilario Quarry, MD 08/04/13 [phone]

## 2014-02-19 ENCOUNTER — Other Ambulatory Visit: Payer: Self-pay | Admitting: Adult Health

## 2014-03-06 ENCOUNTER — Ambulatory Visit (INDEPENDENT_AMBULATORY_CARE_PROVIDER_SITE_OTHER): Payer: 59 | Admitting: Adult Health

## 2014-03-06 ENCOUNTER — Other Ambulatory Visit: Payer: Self-pay | Admitting: Obstetrics and Gynecology

## 2014-03-06 ENCOUNTER — Encounter: Payer: Self-pay | Admitting: Adult Health

## 2014-03-06 ENCOUNTER — Other Ambulatory Visit (HOSPITAL_COMMUNITY)
Admission: RE | Admit: 2014-03-06 | Discharge: 2014-03-06 | Disposition: A | Discharge status: Home / Self Care | Payer: 59 | Source: Ambulatory Visit | Attending: Adult Health | Admitting: Adult Health

## 2014-03-06 VITALS — BP 140/80 | HR 76 | Ht 62.0 in | Wt 206.0 lb

## 2014-03-06 DIAGNOSIS — Z1212 Encounter for screening for malignant neoplasm of rectum: Secondary | ICD-10-CM

## 2014-03-06 DIAGNOSIS — Z1151 Encounter for screening for human papillomavirus (HPV): Secondary | ICD-10-CM | POA: Insufficient documentation

## 2014-03-06 DIAGNOSIS — Z01419 Encounter for gynecological examination (general) (routine) without abnormal findings: Secondary | ICD-10-CM | POA: Insufficient documentation

## 2014-03-06 DIAGNOSIS — Z1231 Encounter for screening mammogram for malignant neoplasm of breast: Secondary | ICD-10-CM

## 2014-03-06 DIAGNOSIS — N920 Excessive and frequent menstruation with regular cycle: Secondary | ICD-10-CM | POA: Insufficient documentation

## 2014-03-06 HISTORY — DX: Excessive and frequent menstruation with regular cycle: N92.0

## 2014-03-06 LAB — HEMOCCULT GUIAC POC 1CARD (OFFICE): Fecal Occult Blood, POC: NEGATIVE

## 2014-03-06 NOTE — Patient Instructions (Signed)
Physical in 1 year Mammogram now [phone] Colonoscopy per Dr Learta Codding with PCP Return in 2 weeks for [place]

## 2014-03-06 NOTE — Progress Notes (Signed)
Patient ID: Emma Pittman, female   DOB: Jul 31, 1961, 53 y.o.   MRN: [id number] History of Present Illness: Emma Pittman is a 53 year old white female, married in for pap and physical,she has not one in years.She is sp ablation and is now having spotting and increased cramping.She has not had mammogram in 10 years, but did have colonoscopy 2 years ago.she has been caring for mom and dad, now both decreased, and her husband had kidney failure but is better.Her daughter is 10 and getting married in November.   Current Medications, Allergies, Past Medical History, Past Surgical History, Family History and Social History were reviewed in [employer] record.   Past Medical History  Diagnosis Date  . PONV (postoperative nausea and vomiting)   . Hypertension   . Hypercholesteremia   . Vertigo   . Vitiligo   . Spotting 03/06/2014   Past Surgical History  Procedure Laterality Date  . Endometrial ablation  8 yrs ago-eure    aph  . Tubal ligation  17 yrs & 10 yrs     aph-Dr Ferguson:also had dye studies of tubes with opening of one tube  . Appendectomy  age 66  . Cataract extraction w/phaco  08/19/2011    Procedure: CATARACT EXTRACTION PHACO AND INTRAOCULAR LENS PLACEMENT (IOC);  Surgeon: Tonny Branch;  Location: [facility];  Service: Ophthalmology;  Laterality: Right;  CDE:9.50  . Cataract extraction w/phaco  09/06/2011    Procedure: CATARACT EXTRACTION PHACO AND INTRAOCULAR LENS PLACEMENT (IOC);  Surgeon: Tonny Branch;  Location: [facility];  Service: Ophthalmology;  Laterality: Left;  CDE: 5.25  Medications: tylenol prn, Lipitor 10 mg 1 daily and Zestoretic 10-12.5 1 daily.  Review of Systems: Patient denies any headaches, blurred vision, shortness of breath, chest pain, abdominal pain, problems with bowel movements, urination, or intercourse. No joint pain or mood swings.See HPI for positives.    Physical Exam:BP 140/80  Pulse 76  Ht 5\' 2"  (1.575 m)  Wt 206 lb (93.441 kg)  BMI 37.67  kg/m2 General:  Well developed, well nourished, no acute distress Skin:  Warm and dry Neck:  Midline trachea, normal thyroid Lungs; Clear to auscultation bilaterally Breast:  No dominant palpable mass, retraction, or nipple discharge, has vitiligo under breasts Cardiovascular: Regular rate and rhythm Abdomen:  Soft, non tender, no hepatosplenomegaly Pelvic:  External genitalia is normal in appearance. Has vitiligo in groin. The vagina is normal in appearance.   The cervix is bulbous.Pap with HPV performed.  Uterus is felt to be normal size, shape, and contour.  No  adnexal masses or tenderness noted. Rectal: Good sphincter tone, no polyps, or hemorrhoids felt.  Hemoccult negative. Extremities:  No swelling or varicosities noted Psych:  No mood changes, alert and cooperative, seems happy   Impression: Routine gyn exam Spotting after ablation, with cramps    Plan: Physical in 1 year Mammogram now and yearly Colonoscopy per Dr Oneida Alar Return in 2 weeks for [place] to assess uterus Labs with PCP

## 2014-03-07 ENCOUNTER — Ambulatory Visit (HOSPITAL_COMMUNITY)
Admission: RE | Admit: 2014-03-07 | Discharge: 2014-03-07 | Disposition: A | Discharge status: Home / Self Care | Payer: 59 | Source: Ambulatory Visit | Attending: Obstetrics and Gynecology | Admitting: Obstetrics and Gynecology

## 2014-03-07 DIAGNOSIS — Z1231 Encounter for screening mammogram for malignant neoplasm of breast: Secondary | ICD-10-CM | POA: Insufficient documentation

## 2014-03-20 ENCOUNTER — Ambulatory Visit (INDEPENDENT_AMBULATORY_CARE_PROVIDER_SITE_OTHER): Payer: 59

## 2014-03-20 ENCOUNTER — Encounter: Payer: Self-pay | Admitting: Adult Health

## 2014-03-20 ENCOUNTER — Ambulatory Visit (INDEPENDENT_AMBULATORY_CARE_PROVIDER_SITE_OTHER): Payer: 59 | Admitting: Adult Health

## 2014-03-20 VITALS — BP 122/62 | Ht 62.0 in | Wt 206.0 lb

## 2014-03-20 DIAGNOSIS — N920 Excessive and frequent menstruation with regular cycle: Secondary | ICD-10-CM

## 2014-03-20 DIAGNOSIS — N898 Other specified noninflammatory disorders of vagina: Secondary | ICD-10-CM

## 2014-03-20 NOTE — Patient Instructions (Signed)
Follow up prn

## 2014-03-20 NOTE — Progress Notes (Signed)
Subjective:     Patient ID: Emma Pittman, female   DOB: 11-29-1961, 53 y.o.   MRN: [id number]  HPI Emma Pittman is here for [place] to assess spotting after ablation and cramps.  Review of Systems See HPI Reviewed past medical,surgical, social and family history. Reviewed medications and allergies.     Objective:   Physical Exam BP 122/62  Ht 5\' 2"  (1.575 m)  Wt 206 lb (93.441 kg)  BMI 37.67 kg/m2Reviewed [place] with pt.   Uterus 7.3 x 4.5 x 3.7 cm, no myometrial masses noted  Endometrium 3.6 mm, symmetrical, s/p ablation  Right ovary 2.0 x 1.4 x 1.2 cm,  Left ovary 2.6 x 1.1 x 1.1 cm,  No free fluid or adnexal masses noted within pelvis  Technician Comments:  Anteverted uterus noted, endometrium-3.63mm symmetrical, bilateral adnexa/ovaries appears wnl, no free fluid noted within pelvis  Pap reviewed with pt was normal with negative HPV.  Assessment:     Spotting after ablation    Plan:    Use tylenol or advil for cramps Follow up prn Physical in 1 year Pap in 3 years

## 2014-08-22 ENCOUNTER — Emergency Department (HOSPITAL_COMMUNITY): Payer: 59

## 2014-08-22 ENCOUNTER — Encounter (HOSPITAL_COMMUNITY): Payer: Self-pay | Admitting: Emergency Medicine

## 2014-08-22 ENCOUNTER — Emergency Department (HOSPITAL_COMMUNITY)
Admission: EM | Admit: 2014-08-22 | Discharge: 2014-08-23 | Disposition: A | Discharge status: Home / Self Care | Payer: 59 | Attending: Emergency Medicine | Admitting: Emergency Medicine

## 2014-08-22 DIAGNOSIS — Z9889 Other specified postprocedural states: Secondary | ICD-10-CM | POA: Diagnosis not present

## 2014-08-22 DIAGNOSIS — Z87891 Personal history of nicotine dependence: Secondary | ICD-10-CM | POA: Insufficient documentation

## 2014-08-22 DIAGNOSIS — E7800 Pure hypercholesterolemia, unspecified: Secondary | ICD-10-CM | POA: Diagnosis not present

## 2014-08-22 DIAGNOSIS — K5900 Constipation, unspecified: Secondary | ICD-10-CM | POA: Diagnosis not present

## 2014-08-22 DIAGNOSIS — R112 Nausea with vomiting, unspecified: Secondary | ICD-10-CM | POA: Diagnosis not present

## 2014-08-22 DIAGNOSIS — Z79899 Other long term (current) drug therapy: Secondary | ICD-10-CM | POA: Diagnosis not present

## 2014-08-22 DIAGNOSIS — Z9851 Tubal ligation status: Secondary | ICD-10-CM | POA: Insufficient documentation

## 2014-08-22 DIAGNOSIS — Z9089 Acquired absence of other organs: Secondary | ICD-10-CM | POA: Diagnosis not present

## 2014-08-22 DIAGNOSIS — R109 Unspecified abdominal pain: Secondary | ICD-10-CM | POA: Insufficient documentation

## 2014-08-22 DIAGNOSIS — I10 Essential (primary) hypertension: Secondary | ICD-10-CM | POA: Insufficient documentation

## 2014-08-22 DIAGNOSIS — R1013 Epigastric pain: Secondary | ICD-10-CM | POA: Diagnosis not present

## 2014-08-22 LAB — URINALYSIS, ROUTINE W REFLEX MICROSCOPIC
Bilirubin Urine: NEGATIVE
Glucose, UA: NEGATIVE mg/dL
Hgb urine dipstick: NEGATIVE
Ketones, ur: 40 mg/dL — AB
Leukocytes, UA: NEGATIVE
Nitrite: NEGATIVE
Protein, ur: NEGATIVE mg/dL
Specific Gravity, Urine: 1.01 (ref 1.005–1.030)
Urobilinogen, UA: 0.2 mg/dL (ref 0.0–1.0)
pH: 8.5 — ABNORMAL HIGH (ref 5.0–8.0)

## 2014-08-22 LAB — COMPREHENSIVE METABOLIC PANEL
ALT: 12 U/L (ref 0–35)
AST: 17 U/L (ref 0–37)
Albumin: 4 g/dL (ref 3.5–5.2)
Alkaline Phosphatase: 87 U/L (ref 39–117)
Anion gap: 17 — ABNORMAL HIGH (ref 5–15)
BUN: 9 mg/dL (ref 6–23)
CO2: 24 mEq/L (ref 19–32)
Calcium: 9.9 mg/dL (ref 8.4–10.5)
Chloride: 97 mEq/L (ref 96–112)
Creatinine, Ser: 0.72 mg/dL (ref 0.50–1.10)
GFR calc Af Amer: >90 mL/min (ref >90)
GFR calc non Af Amer: >90 mL/min (ref >90)
Glucose, Bld: 111 mg/dL — ABNORMAL HIGH (ref 70–99)
Potassium: 3.4 mEq/L — ABNORMAL LOW (ref 3.7–5.3)
Sodium: 138 mEq/L (ref 137–147)
Total Bilirubin: 0.8 mg/dL (ref 0.3–1.2)
Total Protein: 8 g/dL (ref 6.0–8.3)

## 2014-08-22 LAB — AMYLASE: Amylase: 42 U/L (ref 0–105)

## 2014-08-22 LAB — CBC WITH DIFFERENTIAL/PLATELET
Basophils Absolute: 0 10*3/uL (ref 0.0–0.1)
Basophils Relative: 0 % (ref 0–1)
Eosinophils Absolute: 0 10*3/uL (ref 0.0–0.7)
Eosinophils Relative: 0 % (ref 0–5)
HCT: 39.9 % (ref 36.0–46.0)
Hemoglobin: 13.9 g/dL (ref 12.0–15.0)
Lymphocytes Relative: 17 % (ref 12–46)
Lymphs Abs: 2 10*3/uL (ref 0.7–4.0)
MCH: 28.8 pg (ref 26.0–34.0)
MCHC: 34.8 g/dL (ref 30.0–36.0)
MCV: 82.6 fL (ref 78.0–100.0)
Monocytes Absolute: 0.9 10*3/uL (ref 0.1–1.0)
Monocytes Relative: 7 % (ref 3–12)
Neutro Abs: 9 10*3/uL — ABNORMAL HIGH (ref 1.7–7.7)
Neutrophils Relative %: 76 % (ref 43–77)
Platelets: 271 10*3/uL (ref 150–400)
RBC: 4.83 MIL/uL (ref 3.87–5.11)
RDW: 13.1 % (ref 11.5–15.5)
WBC: 12 10*3/uL — ABNORMAL HIGH (ref 4.0–10.5)

## 2014-08-22 LAB — LIPASE, BLOOD: Lipase: 34 U/L (ref 11–59)

## 2014-08-22 MED ORDER — ONDANSETRON HCL 4 MG/2ML IJ SOLN
4.0000 mg | Freq: Once | INTRAMUSCULAR | Status: AC
  Administered 2014-08-23: 4 mg via INTRAVENOUS
  Filled 2014-08-22: qty 2

## 2014-08-22 MED ORDER — SODIUM CHLORIDE 0.9 % IV SOLN
Freq: Once | INTRAVENOUS | Status: AC
  Administered 2014-08-23: via INTRAVENOUS

## 2014-08-22 MED ORDER — IOHEXOL 300 MG/ML  SOLN
50.0000 mL | Freq: Once | INTRAMUSCULAR | Status: AC | PRN
  Administered 2014-08-22: 50 mL via ORAL

## 2014-08-22 MED ORDER — IOHEXOL 300 MG/ML  SOLN
100.0000 mL | Freq: Once | INTRAMUSCULAR | Status: AC | PRN
  Administered 2014-08-22: 100 mL via INTRAVENOUS

## 2014-08-22 MED ORDER — HYDROMORPHONE HCL PF 1 MG/ML IJ SOLN
1.0000 mg | Freq: Once | INTRAMUSCULAR | Status: AC
  Administered 2014-08-23: 1 mg via INTRAVENOUS
  Filled 2014-08-22: qty 1

## 2014-08-22 NOTE — ED Notes (Addendum)
Nausea, vomiting,  Upper abd  Pain, Took laxative today.  No bm for 2 days.Says abd pain is "sharp"

## 2014-08-23 MED ORDER — RANITIDINE HCL 150 MG PO CAPS: 150.0000 mg | ORAL_CAPSULE | Freq: Two times a day (BID) | ORAL | Status: DC

## 2014-08-23 MED ORDER — ONDANSETRON HCL 4 MG PO TABS: 4.0000 mg | ORAL_TABLET | Freq: Four times a day (QID) | ORAL | Status: DC

## 2014-08-23 MED ORDER — PANTOPRAZOLE SODIUM 40 MG IV SOLR
40.0000 mg | Freq: Once | INTRAVENOUS | Status: AC
  Administered 2014-08-23: 40 mg via INTRAVENOUS
  Filled 2014-08-23: qty 40

## 2014-08-23 NOTE — ED Provider Notes (Signed)
Medical screening examination/treatment/procedure(s) were performed by non-physician practitioner and as supervising physician I was immediately available for consultation/collaboration.    Delora Fuel, MD 00/71/21 [id number]

## 2014-08-23 NOTE — ED Provider Notes (Signed)
CSN: [id number]     Arrival date & time 08/22/14  1955 History   First MD Initiated Contact with Patient 08/22/14 2306     Chief Complaint  Patient presents with  . Abdominal Pain     (Consider location/radiation/quality/duration/timing/severity/associated sxs/prior Treatment) HPI   Emma Pittman is a 52 y.o. female who presents to the Emergency Department complaining of upper abdominal pain for two days.  She describes the pain as sharp and "fullness" sensation.  She reports feeling as though she may be constipated and took a laxative without relief.  She also reports having nausea and at least 4-5 episodes of vomiting several hours prior to ED arrival.  She also reports feeling a "heavy, bloated" sensation to her stomach after eating.  Nothing has made the pain better.  She denies fever, hematemesis, heavy ASA or ibuprofen intake, alcohol, chest pain, shortness of breath, or recent surgery.     Past Medical History  Diagnosis Date  . PONV (postoperative nausea and vomiting)   . Hypertension   . Hypercholesteremia   . Vertigo   . Vitiligo   . Spotting 03/06/2014   Past Surgical History  Procedure Laterality Date  . Endometrial ablation  8 yrs ago-eure    aph  . Tubal ligation  17 yrs & 10 yrs     aph-Dr Ferguson:also had dye studies of tubes with opening of one tube  . Appendectomy  age 72  . Cataract extraction w/phaco  08/19/2011    Procedure: CATARACT EXTRACTION PHACO AND INTRAOCULAR LENS PLACEMENT (IOC);  Surgeon: Tonny Branch;  Location: [facility];  Service: Ophthalmology;  Laterality: Right;  CDE:9.50  . Cataract extraction w/phaco  09/06/2011    Procedure: CATARACT EXTRACTION PHACO AND INTRAOCULAR LENS PLACEMENT (IOC);  Surgeon: Tonny Branch;  Location: [facility];  Service: Ophthalmology;  Laterality: Left;  CDE: 5.25  . Colonoscopy     Family History  Problem Relation Age of Onset  . Anesthesia problems Neg Hx   . Hypotension Neg Hx   . Malignant hyperthermia Neg Hx   .  Pseudochol deficiency Neg Hx   . Heart attack Mother   . Cancer Mother     colon  . Kidney failure Father   . Congestive Heart Failure Father   . Congestive Heart Failure Brother    History  Substance Use Topics  . Smoking status: Former Smoker -- 0.50 packs/day for 4 years    Types: Cigarettes    Quit date: 08/12/1991  . Smokeless tobacco: Never Used  . Alcohol Use: Yes     Comment: occassional   OB History   Grav Para Term Preterm Abortions TAB SAB Ect Mult Living   1 1        1      Review of Systems  Constitutional: Negative for fever, chills and appetite change.  Respiratory: Negative for shortness of breath.   Cardiovascular: Negative for chest pain.  Gastrointestinal: Positive for nausea, vomiting, abdominal pain and constipation. Negative for diarrhea, blood in stool and abdominal distention.  Genitourinary: Negative for dysuria, flank pain, decreased urine volume and difficulty urinating.  Musculoskeletal: Negative for back pain.  Skin: Negative for color change and rash.  Neurological: Negative for dizziness, weakness and numbness.  Hematological: Negative for adenopathy.  All other systems reviewed and are negative.     Allergies  Review of patient's allergies indicates no known allergies.  Home Medications   Prior to Admission medications   Medication Sig Start Date End Date Taking?  Authorizing Provider  acetaminophen (TYLENOL) 325 MG tablet Take 325 mg by mouth every 6 (six) hours as needed. For pain    Yes Historical Provider, MD  atorvastatin (LIPITOR) 10 MG tablet Take 10 mg by mouth every morning.    Yes Historical Provider, MD  bismuth subsalicylate (PEPTO BISMOL) 262 MG/15ML suspension Take 30 mLs by mouth every 6 (six) hours as needed for indigestion or diarrhea or loose stools.   Yes Historical Provider, MD  lisinopril-hydrochlorothiazide (PRINZIDE,ZESTORETIC) 10-12.5 MG per tablet Take 1 tablet by mouth every morning.    Yes Historical Provider, MD    magnesium hydroxide (MILK OF MAGNESIA) 400 MG/5ML suspension Take 15 mLs by mouth daily as needed for mild constipation.   Yes Historical Provider, MD   BP 129/67  Pulse 76  Temp(Src) 99.4 F (37.4 C) (Oral)  Resp 18  Ht 5\' 2"  (1.575 m)  Wt 206 lb (93.441 kg)  BMI 37.67 kg/m2  SpO2 99% Physical Exam  Nursing note and vitals reviewed. Constitutional: She is oriented to person, place, and time. She appears well-developed and well-nourished. No distress.  HENT:  Head: Normocephalic and atraumatic.  Mouth/Throat: Oropharynx is clear and moist.  Neck: Normal range of motion. Neck supple.  Cardiovascular: Normal rate, regular rhythm, normal heart sounds and intact distal pulses.   No murmur heard. Pulmonary/Chest: Effort normal and breath sounds normal. No respiratory distress.  Abdominal: Soft. Normal appearance and bowel sounds are normal. She exhibits no distension and no mass. There is no hepatosplenomegaly. There is tenderness in the epigastric area. There is no rigidity, no rebound, no guarding, no CVA tenderness and no tenderness at McBurney's point.    Mild to moderate epigastric tenderness to deep palpation.  No guarding or rebound tenderness, no RLQ tenderness.  Musculoskeletal: Normal range of motion. She exhibits no edema.  Neurological: She is alert and oriented to person, place, and time. She exhibits normal muscle tone. Coordination normal.  Skin: Skin is warm and dry.    ED Course  Procedures (including critical care time) Labs Review Labs Reviewed  CBC WITH DIFFERENTIAL - Abnormal; Notable for the following:    WBC 12.0 (*)    Neutro Abs 9.0 (*)    All other components within normal limits  COMPREHENSIVE METABOLIC PANEL - Abnormal; Notable for the following:    Potassium 3.4 (*)    Glucose, Bld 111 (*)    Anion gap 17 (*)    All other components within normal limits  URINALYSIS, ROUTINE W REFLEX MICROSCOPIC - Abnormal; Notable for the following:    pH 8.5 (*)     Ketones, ur 40 (*)    All other components within normal limits  LIPASE, BLOOD  AMYLASE    Imaging Review Ct Abdomen Pelvis W Contrast  08/23/2014   CLINICAL DATA:  Sharp upper abdominal pain with nausea and vomiting. Constipation for 2 days.  EXAM: CT ABDOMEN AND PELVIS WITH CONTRAST  TECHNIQUE: Multidetector CT imaging of the abdomen and pelvis was performed using the standard protocol following bolus administration of intravenous contrast.  CONTRAST:  69mL OMNIPAQUE IOHEXOL 300 MG/ML SOLN, 122mL OMNIPAQUE IOHEXOL 300 MG/ML SOLN  COMPARISON:  None.  FINDINGS: LUNG BASES: Included view of the lung bases are clear. Visualized heart and pericardium are unremarkable. Oral contrast in the visualized esophagus may reflect reflux.  SOLID ORGANS: The liver, spleen, gallbladder, pancreas and adrenal glands are unremarkable.  GASTROINTESTINAL TRACT: The stomach, small and large bowel are normal in course and caliber without  inflammatory changes. No significant amount retained large bowel stool. Status post appendectomy.  KIDNEYS/ URINARY TRACT: Kidneys are orthotopic, demonstrating symmetric enhancement. No nephrolithiasis, hydronephrosis or solid renal masses. The unopacified ureters are normal in course and caliber. Delayed imaging through the kidneys demonstrates symmetric prompt contrast excretion within the proximal urinary collecting system. Urinary bladder is partially distended and unremarkable.  PERITONEUM/RETROPERITONEUM: Mild central mesenteric foggy appearance/"misty" mesentery. No intraperitoneal free fluid nor free air. Aortoiliac vessels are normal in course and caliber prominent minimal calcific atherosclerosis. No lymphadenopathy by CT size criteria. Internal reproductive organs are unremarkable.  SOFT TISSUE/OSSEOUS STRUCTURES: Nonsuspicious. Tiny fat containing umbilical hernia.  IMPRESSION: Mild "misty" mesentery is likely reactive.  No significant amount of retained large bowel stool. No  bowel obstruction.   Electronically Signed   By: Elon Alas   On: 08/23/2014 00:05     EKG Interpretation None      MDM   Final diagnoses:  Epigastric pain    0100  Consulted rads, Dr. Cyril Mourning, regarding CT results.  Confirmed that GB is nml appearing, likely duodenitis.  Patient is feeling better, pain and nausea have resolved after IVF's, dilaudid, zofran and protonix.  No concerning sx's for acute abdomen.  Pt appears stable for d/c, she agrees to bland diet, Rx for zantac, and close f/u with her PMD.  I have also advised patient to return here if any worsening symptoms develop. She agrees and verbalized understanding.      Zyair Rhein L. Vanessa Dublin, PA-C 08/23/14 1237

## 2014-08-23 NOTE — Discharge Instructions (Signed)

## 2014-10-14 ENCOUNTER — Encounter (HOSPITAL_COMMUNITY): Payer: Self-pay | Admitting: Emergency Medicine

## 2015-02-03 ENCOUNTER — Other Ambulatory Visit: Payer: Self-pay | Admitting: Adult Health

## 2015-02-03 DIAGNOSIS — Z1231 Encounter for screening mammogram for malignant neoplasm of breast: Secondary | ICD-10-CM

## 2015-03-12 ENCOUNTER — Ambulatory Visit (HOSPITAL_COMMUNITY): Payer: 59

## 2015-03-19 ENCOUNTER — Ambulatory Visit (HOSPITAL_COMMUNITY)
Admission: RE | Admit: 2015-03-19 | Discharge: 2015-03-19 | Disposition: A | Discharge status: Home / Self Care | Payer: 59 | Source: Ambulatory Visit | Attending: Adult Health | Admitting: Adult Health

## 2015-03-19 DIAGNOSIS — Z1231 Encounter for screening mammogram for malignant neoplasm of breast: Secondary | ICD-10-CM | POA: Diagnosis not present

## 2015-12-22 DIAGNOSIS — Z23 Encounter for immunization: Secondary | ICD-10-CM | POA: Diagnosis not present

## 2016-01-07 MED FILL — LISINOPRIL-HCTZ 10-12.5 MG: 10-12.5 | 90 days supply | Qty: 90 | Fill #1

## 2016-01-30 MED FILL — ATORVASTATIN 10 MG TABLET: 10 | 90 days supply | Qty: 90 | Fill #0

## 2016-02-09 ENCOUNTER — Other Ambulatory Visit (HOSPITAL_COMMUNITY): Payer: Self-pay | Admitting: Physician Assistant

## 2016-02-09 DIAGNOSIS — Z1231 Encounter for screening mammogram for malignant neoplasm of breast: Secondary | ICD-10-CM

## 2016-02-27 DIAGNOSIS — Z1389 Encounter for screening for other disorder: Secondary | ICD-10-CM | POA: Diagnosis not present

## 2016-02-27 DIAGNOSIS — A09 Infectious gastroenteritis and colitis, unspecified: Secondary | ICD-10-CM | POA: Diagnosis not present

## 2016-02-27 DIAGNOSIS — Z6836 Body mass index (BMI) 36.0-36.9, adult: Secondary | ICD-10-CM | POA: Diagnosis not present

## 2016-02-27 DIAGNOSIS — K2970 Gastritis, unspecified, without bleeding: Secondary | ICD-10-CM | POA: Diagnosis not present

## 2016-02-27 DIAGNOSIS — K5289 Other specified noninfective gastroenteritis and colitis: Secondary | ICD-10-CM | POA: Diagnosis not present

## 2016-03-22 ENCOUNTER — Ambulatory Visit (HOSPITAL_COMMUNITY)
Admission: RE | Admit: 2016-03-22 | Discharge: 2016-03-22 | Disposition: A | Discharge status: Home / Self Care | Payer: 59 | Source: Ambulatory Visit | Attending: Physician Assistant | Admitting: Physician Assistant

## 2016-03-22 ENCOUNTER — Ambulatory Visit (INDEPENDENT_AMBULATORY_CARE_PROVIDER_SITE_OTHER): Payer: 59 | Admitting: Adult Health

## 2016-03-22 ENCOUNTER — Encounter: Payer: Self-pay | Admitting: Adult Health

## 2016-03-22 VITALS — BP 136/72 | HR 90 | Ht 62.5 in | Wt 208.0 lb

## 2016-03-22 DIAGNOSIS — Z01411 Encounter for gynecological examination (general) (routine) with abnormal findings: Secondary | ICD-10-CM | POA: Diagnosis not present

## 2016-03-22 DIAGNOSIS — K649 Unspecified hemorrhoids: Secondary | ICD-10-CM

## 2016-03-22 DIAGNOSIS — Z1212 Encounter for screening for malignant neoplasm of rectum: Secondary | ICD-10-CM

## 2016-03-22 DIAGNOSIS — L80 Vitiligo: Secondary | ICD-10-CM | POA: Insufficient documentation

## 2016-03-22 DIAGNOSIS — Z1231 Encounter for screening mammogram for malignant neoplasm of breast: Secondary | ICD-10-CM | POA: Insufficient documentation

## 2016-03-22 DIAGNOSIS — Z01419 Encounter for gynecological examination (general) (routine) without abnormal findings: Secondary | ICD-10-CM

## 2016-03-22 HISTORY — DX: Unspecified hemorrhoids: K64.9

## 2016-03-22 LAB — HEMOCCULT GUIAC POC 1CARD (OFFICE): Fecal Occult Blood, POC: NEGATIVE

## 2016-03-22 MED ORDER — HYDROCORTISONE ACE-PRAMOXINE 2.5-1 % RE CREA: 1.0000 "application " | TOPICAL_CREAM | Freq: Three times a day (TID) | RECTAL | Status: DC

## 2016-03-22 NOTE — Patient Instructions (Signed)
Physical in 1 year, pap in 1 year Mammogram yearly Labs with PCP Colonoscopy per GI

## 2016-03-22 NOTE — Progress Notes (Signed)
Patient ID: Emma Pittman, female   DOB: 03-Dec-1961, 55 y.o.   MRN: [id number] History of Present Illness:  Emma Pittman is a 55 year old white female,married in for a well woman gyn exam, she had a normal pap with negative HPV 03/06/14.She says she has hemorrhoids, from recent GI bug. PCP is [facility].  Current Medications, Allergies, Past Medical History, Past Surgical History, Family History and Social History were reviewed in [employer] record.     Review of Systems:  Patient denies any headaches, hearing loss, fatigue, blurred vision, shortness of breath, chest pain, abdominal pain, problems with bowel movements, urination, or intercourse. No joint pain or mood swings.See HPI for positives.   Physical Exam:BP 136/72 mmHg  Pulse 90  Ht 5' 2.5" (1.588 m)  Wt 208 lb (94.348 kg)  BMI 37.41 kg/m2 General:  Well developed, well nourished, no acute distress Skin:  Warm and dry Neck:  Midline trachea, normal thyroid, good ROM, no lymphadenopathy Lungs; Clear to auscultation bilaterally Breast:  No dominant palpable mass, retraction, or nipple discharge,she has vitiligo under both breasts, and had her mammogram this morning Cardiovascular: Regular rate and rhythm Abdomen:  Soft, non tender, no hepatosplenomegaly Pelvic:  External genitalia is normal in appearance, no lesions. Has vitiligo on labia. The vagina is normal in appearance. Urethra has no lesions or masses. The cervix is bulbous.  Uterus is felt to be normal size, shape, and contour.  No adnexal masses or tenderness noted.Bladder is non tender, no masses felt. Rectal: Good sphincter tone, no polyps, positive hemorrhoids felt.  Hemoccult negative. Extremities/musculoskeletal:  No swelling or varicosities noted, no clubbing or cyanosis Psych:  No mood changes, alert and cooperative,seems happy   Impression: Well woman gyn exam no pap Hemorrhoids Vitiligo     Plan: Pap and physical in 1 year Try  anal pram HC,sample given Mammogram yearly Labs with PCP Colonoscopy per GI

## 2016-04-14 MED FILL — LISINOPRIL-HCTZ 10-12.5 MG: 10-12.5 | 90 days supply | Qty: 90 | Fill #2

## 2016-08-10 ENCOUNTER — Encounter (HOSPITAL_COMMUNITY): Payer: Self-pay | Admitting: Emergency Medicine

## 2016-08-10 ENCOUNTER — Emergency Department (HOSPITAL_COMMUNITY)
Admission: EM | Admit: 2016-08-10 | Discharge: 2016-08-10 | Disposition: A | Discharge status: Home / Self Care | Payer: BLUE CROSS/BLUE SHIELD | Attending: Emergency Medicine | Admitting: Emergency Medicine

## 2016-08-10 ENCOUNTER — Emergency Department (HOSPITAL_COMMUNITY): Payer: BLUE CROSS/BLUE SHIELD

## 2016-08-10 DIAGNOSIS — Y999 Unspecified external cause status: Secondary | ICD-10-CM | POA: Diagnosis not present

## 2016-08-10 DIAGNOSIS — X501XXA Overexertion from prolonged static or awkward postures, initial encounter: Secondary | ICD-10-CM | POA: Diagnosis not present

## 2016-08-10 DIAGNOSIS — S92352A Displaced fracture of fifth metatarsal bone, left foot, initial encounter for closed fracture: Secondary | ICD-10-CM | POA: Insufficient documentation

## 2016-08-10 DIAGNOSIS — S99922A Unspecified injury of left foot, initial encounter: Secondary | ICD-10-CM | POA: Diagnosis present

## 2016-08-10 DIAGNOSIS — Y929 Unspecified place or not applicable: Secondary | ICD-10-CM | POA: Insufficient documentation

## 2016-08-10 DIAGNOSIS — Z79899 Other long term (current) drug therapy: Secondary | ICD-10-CM | POA: Diagnosis not present

## 2016-08-10 DIAGNOSIS — Y939 Activity, unspecified: Secondary | ICD-10-CM | POA: Insufficient documentation

## 2016-08-10 DIAGNOSIS — I10 Essential (primary) hypertension: Secondary | ICD-10-CM | POA: Diagnosis not present

## 2016-08-10 DIAGNOSIS — Z87891 Personal history of nicotine dependence: Secondary | ICD-10-CM | POA: Diagnosis not present

## 2016-08-10 DIAGNOSIS — S92302A Fracture of unspecified metatarsal bone(s), left foot, initial encounter for closed fracture: Secondary | ICD-10-CM

## 2016-08-10 MED ORDER — IBUPROFEN 800 MG PO TABS
800.0000 mg | ORAL_TABLET | Freq: Once | ORAL | Status: AC
  Administered 2016-08-10: 800 mg via ORAL
  Filled 2016-08-10: qty 1

## 2016-08-10 MED ORDER — HYDROCODONE-ACETAMINOPHEN 5-325 MG PO TABS: 1.0000 | ORAL_TABLET | ORAL | 0 refills | Status: DC | PRN

## 2016-08-10 NOTE — Discharge Instructions (Signed)
Take ibuprofen every 6 hrs and tylenol every 4 hrs for pain.  Use ice and elevate. For severe pain take norco or vicodin however realize they have the potential for addiction and it can make you sleepy and has tylenol in it.  No operating machinery while taking. If you were given medicines take as directed.  If you are on coumadin or contraceptives realize their levels and effectiveness is altered by many different medicines.  If you have any reaction (rash, tongues swelling, other) to the medicines stop taking and see a physician.    If your blood pressure was elevated in the ER make sure you follow up for management with a primary doctor or return for chest pain, shortness of breath or stroke symptoms.  Please follow up as directed and return to the ER or see a physician for new or worsening symptoms.  Thank you. Vitals:   08/10/16 0806  BP: 135/76  Pulse: 81  Resp: 15  Temp: 98.2 F (36.8 C)  TempSrc: Oral  SpO2: 100%  Weight: 198 lb (89.8 kg)  Height: 5\' 3"  (1.6 m)

## 2016-08-10 NOTE — ED Triage Notes (Signed)
Patient tripped this morning twisting ankle/foot. Left foot pain, swelling. Bruising noted to left lateral foot area. CMS intact.

## 2016-08-10 NOTE — ED Provider Notes (Signed)
[facility] DEPT Provider Note   CSN: [id number] Arrival date & time: 08/10/16  [id number]  By signing my name below, I, Soijett Blue, attest that this documentation has been prepared under the direction and in the presence of Elnora Morrison, MD. Electronically Signed: Soijett Blue, ED Scribe. 08/10/16. 8:26 AM.    History   Chief Complaint Chief Complaint  Patient presents with  . Foot Pain    HPI Emma Pittman is a 55 y.o. female with a medical hx of HTN, who presents to the Emergency Department complaining of left foot pain onset PTA. She notes that she was ambulating when she twisted over a water hose. Pt is having associated symptoms of gait problem due to pain and mild left foot swelling. She notes that she has tried ice and ace wrap without medications for the relief of her symptoms. She denies color change, wound, rash, left ankle pain, and any other symptoms.    The history is provided by the patient. No language interpreter was used.  Foot Pain  This is a new problem. The current episode started less than 1 hour ago. The problem occurs rarely. The problem has not changed since onset.The symptoms are aggravated by walking. Nothing relieves the symptoms. She has tried a cold compress for the symptoms. The treatment provided no relief.    Past Medical History:  Diagnosis Date  . Hemorrhoids 03/22/2016  . Hypercholesteremia   . Hypertension   . PONV (postoperative nausea and vomiting)   . Spotting 03/06/2014  . Vertigo   . Vitiligo     Patient Active Problem List   Diagnosis Date Noted  . Hemorrhoids 03/22/2016  . Vitiligo 03/22/2016  . Spotting 03/06/2014    Past Surgical History:  Procedure Laterality Date  . APPENDECTOMY  age 86  . CATARACT EXTRACTION W/PHACO  08/19/2011   Procedure: CATARACT EXTRACTION PHACO AND INTRAOCULAR LENS PLACEMENT (IOC);  Surgeon: Tonny Branch;  Location: [facility];  Service: Ophthalmology;  Laterality: Right;  CDE:9.50  . CATARACT EXTRACTION  W/PHACO  09/06/2011   Procedure: CATARACT EXTRACTION PHACO AND INTRAOCULAR LENS PLACEMENT (IOC);  Surgeon: Tonny Branch;  Location: [facility];  Service: Ophthalmology;  Laterality: Left;  CDE: 5.25  . COLONOSCOPY    . ENDOMETRIAL ABLATION  8 yrs ago-eure   aph  . TUBAL LIGATION  17 yrs & 10 yrs    aph-Dr Ferguson:also had dye studies of tubes with opening of one tube    OB History    Gravida Para Term Preterm AB Living   1 1       1    SAB TAB Ectopic Multiple Live Births           1       Home Medications    Prior to Admission medications   Medication Sig Start Date End Date Taking? Authorizing Provider  acetaminophen (TYLENOL) 325 MG tablet Take 325 mg by mouth every 6 (six) hours as needed. For pain     Historical Provider, MD  atorvastatin (LIPITOR) 10 MG tablet Take 10 mg by mouth every morning.     Historical Provider, MD  bismuth subsalicylate (PEPTO BISMOL) 262 MG/15ML suspension Take 30 mLs by mouth every 6 (six) hours as needed for indigestion or diarrhea or loose stools.    Historical Provider, MD  HYDROcodone-acetaminophen (NORCO) 5-325 MG tablet Take 1-2 tablets by mouth every 4 (four) hours as needed. 08/10/16   Elnora Morrison, MD  hydrocortisone-pramoxine [facility]) 2.5-1 % rectal cream Place 1  application rectally 3 (three) times daily. 03/22/16   Estill Dooms, NP  lisinopril-hydrochlorothiazide (PRINZIDE,ZESTORETIC) 10-12.5 MG per tablet Take 1 tablet by mouth every morning.     Historical Provider, MD  ondansetron (ZOFRAN) 4 MG tablet Take 1 tablet (4 mg total) by mouth every 6 (six) hours. As needed for nausea/vomiting 08/23/14   Kem Parkinson, PA-C    Family History Family History  Problem Relation Age of Onset  . Heart attack Mother   . Cancer Mother     colon  . Kidney failure Father   . Congestive Heart Failure Father   . Congestive Heart Failure Brother   . Anesthesia problems Neg Hx   . Hypotension Neg Hx   . Malignant hyperthermia Neg Hx   .  Pseudochol deficiency Neg Hx     Social History Social History  Substance Use Topics  . Smoking status: Former Smoker    Packs/day: 0.50    Years: 4.00    Types: Cigarettes    Quit date: 08/12/1991  . Smokeless tobacco: Never Used  . Alcohol use Yes     Comment: occassional     Allergies   Review of patient's allergies indicates no known allergies.   Review of Systems Review of Systems  Musculoskeletal: Positive for arthralgias (left foot), gait problem (due to pain) and joint swelling (left foot).  Skin: Negative for color change, rash and wound.     Physical Exam Updated Vital Signs BP 135/76 (BP Location: Left Arm)   Pulse 81   Temp 98.2 F (36.8 C) (Oral)   Resp 15   Ht 5\' 3"  (1.6 m)   Wt 198 lb (89.8 kg)   SpO2 100%   BMI 35.07 kg/m   Physical Exam  Constitutional: She is oriented to person, place, and time. She appears well-developed and well-nourished. No distress.  HENT:  Head: Normocephalic and atraumatic.  Eyes: EOM are normal.  Neck: Neck supple.  Cardiovascular: Normal rate.   Pulmonary/Chest: Effort normal. No respiratory distress.  Abdominal: She exhibits no distension.  Musculoskeletal: Normal range of motion.       Left ankle: Normal. No tenderness.       Left foot: There is tenderness and swelling.  Left foot with moderate tenderness and mild swelling to lateral mid foot. No ankle tenderness or effusion.  Neurological: She is alert and oriented to person, place, and time.  Skin: Skin is warm and dry.  Psychiatric: She has a normal mood and affect. Her behavior is normal.  Nursing note and vitals reviewed.    ED Treatments / Results  DIAGNOSTIC STUDIES: Oxygen Saturation is 100% on RA, nl by my interpretation.    COORDINATION OF CARE: 8:21 AM Discussed treatment plan with pt at bedside which includes left foot xray, RICE, anti-inflammatory, referral and follow up with orthopedist, and pt agreed to plan.   Radiology Dg Foot Complete  Left  Result Date: 08/10/2016 CLINICAL DATA:  55 year old female status post fall down steps with twisting injury and lateral aspect left foot pain. Initial encounter. EXAM: LEFT FOOT - COMPLETE 3+ VIEW COMPARISON:  None. FINDINGS: Transverse fracture through the base of the left fifth metatarsal. Minimal displacement. Small ossific fragments along the lateral cuboid appear to be chronic and/or congenital. No other acute fracture identified. Joint spaces and alignment are normal for age. Calcaneus intact with degenerative spurring. IMPRESSION: 1. Minimally displaced transverse fracture at the base of the left fifth metatarsal. 2. Small chronic or congenital appearing ossific fragments along  the lateral cuboid. No other acute osseous abnormality identified. Electronically Signed   By: Genevie Ann M.D.   On: 08/10/2016 08:39    Procedures Procedures (including critical care time)  Medications Ordered in ED Medications  ibuprofen (ADVIL,MOTRIN) tablet 800 mg (800 mg Oral Given 08/10/16 [id number])     Initial Impression / Assessment and Plan / ED Course  I have reviewed the triage vital signs and the nursing notes.  Pertinent labs & imaging results that were available during my care of the patient were reviewed by me and considered in my medical decision making (see chart for details).  Clinical Course   Low risk fall. Metatarsal fx.  Boot and ortho fup.  Results and differential diagnosis were discussed with the patient/parent/guardian. Xrays were independently reviewed by myself.  Close follow up outpatient was discussed, comfortable with the plan.   Medications  ibuprofen (ADVIL,MOTRIN) tablet 800 mg (800 mg Oral Given 08/10/16 0838)    Vitals:   08/10/16 0806  BP: 135/76  Pulse: 81  Resp: 15  Temp: 98.2 F (36.8 C)  TempSrc: Oral  SpO2: 100%  Weight: 198 lb (89.8 kg)  Height: 5\' 3"  (1.6 m)    Final diagnoses:  Metatarsal fracture, left, closed, initial encounter     Final  Clinical Impressions(s) / ED Diagnoses   Final diagnoses:  Metatarsal fracture, left, closed, initial encounter    New Prescriptions New Prescriptions   HYDROCODONE-ACETAMINOPHEN (NORCO) 5-325 MG TABLET    Take 1-2 tablets by mouth every 4 (four) hours as needed.       Elnora Morrison, MD 08/10/16 [phone]

## 2016-08-18 ENCOUNTER — Ambulatory Visit (INDEPENDENT_AMBULATORY_CARE_PROVIDER_SITE_OTHER): Payer: BLUE CROSS/BLUE SHIELD | Admitting: Orthopedic Surgery

## 2016-08-18 ENCOUNTER — Encounter: Payer: Self-pay | Admitting: Orthopedic Surgery

## 2016-08-18 VITALS — BP 125/77 | HR 85 | Ht 63.0 in | Wt 212.0 lb

## 2016-08-18 DIAGNOSIS — S92302A Fracture of unspecified metatarsal bone(s), left foot, initial encounter for closed fracture: Secondary | ICD-10-CM

## 2016-08-18 MED ORDER — HYDROCODONE-ACETAMINOPHEN 5-325 MG PO TABS: 1.0000 | ORAL_TABLET | Freq: Four times a day (QID) | ORAL | 0 refills | Status: DC | PRN

## 2016-08-18 NOTE — Patient Instructions (Signed)
Weight-bear as tolerated  Short Cam Walker  Return 6 weeks x-ray

## 2016-08-18 NOTE — Progress Notes (Signed)
Chief Complaint  Patient presents with  . Follow-up    LEFT FOOT FRACTURE, DOI 08/10/16   HPI   55 year old female twisted her left foot on the 29th. She complains of lateral foot pain sharp sort of dull with a loose fitting boot. Worse with weightbearing partially relieved with the boot and the pain is over the lateral aspect of the foot  Review of Systems  Constitutional: Negative for chills, fever and weight loss.  Respiratory: Negative for shortness of breath.   Cardiovascular: Negative for chest pain.  Neurological: Negative for tingling.    Past Medical History:  Diagnosis Date  . Hemorrhoids 03/22/2016  . Hypercholesteremia   . Hypertension   . PONV (postoperative nausea and vomiting)   . Spotting 03/06/2014  . Vertigo   . Vitiligo     Past Surgical History:  Procedure Laterality Date  . APPENDECTOMY  age 59  . CATARACT EXTRACTION W/PHACO  08/19/2011   Procedure: CATARACT EXTRACTION PHACO AND INTRAOCULAR LENS PLACEMENT (IOC);  Surgeon: Tonny Branch;  Location: [facility];  Service: Ophthalmology;  Laterality: Right;  CDE:9.50  . CATARACT EXTRACTION W/PHACO  09/06/2011   Procedure: CATARACT EXTRACTION PHACO AND INTRAOCULAR LENS PLACEMENT (IOC);  Surgeon: Tonny Branch;  Location: [facility];  Service: Ophthalmology;  Laterality: Left;  CDE: 5.25  . COLONOSCOPY    . ENDOMETRIAL ABLATION  8 yrs ago-eure   aph  . TUBAL LIGATION  15 yrs & 10 yrs    aph-Dr Ferguson:also had dye studies of tubes with opening of one tube   Family History  Problem Relation Age of Onset  . Heart attack Mother   . Cancer Mother     colon  . Kidney failure Father   . Congestive Heart Failure Father   . Congestive Heart Failure Brother   . Anesthesia problems Neg Hx   . Hypotension Neg Hx   . Malignant hyperthermia Neg Hx   . Pseudochol deficiency Neg Hx    Social History  Substance Use Topics  . Smoking status: Former Smoker    Packs/day: 0.50    Years: 4.00    Types: Cigarettes    Quit date:  08/12/1991  . Smokeless tobacco: Never Used  . Alcohol use Yes     Comment: occassional   Current Meds  Medication Sig  . acetaminophen (TYLENOL) 325 MG tablet Take 325 mg by mouth every 6 (six) hours as needed. For pain   . atorvastatin (LIPITOR) 10 MG tablet Take 10 mg by mouth every morning.   . bismuth subsalicylate (PEPTO BISMOL) 262 MG/15ML suspension Take 30 mLs by mouth every 6 (six) hours as needed for indigestion or diarrhea or loose stools.  Marland Kitchen HYDROcodone-acetaminophen (NORCO) 5-325 MG tablet Take 1-2 tablets by mouth every 4 (four) hours as needed.  . hydrocortisone-pramoxine (ANALPRAM HC) 2.5-1 % rectal cream Place 1 application rectally 3 (three) times daily.  Marland Kitchen lisinopril-hydrochlorothiazide (PRINZIDE,ZESTORETIC) 10-12.5 MG per tablet Take 1 tablet by mouth every morning.   . ondansetron (ZOFRAN) 4 MG tablet Take 1 tablet (4 mg total) by mouth every 6 (six) hours. As needed for nausea/vomiting    BP 125/77   Pulse 85   Ht 5\' 3"  (1.6 m)   Wt 212 lb (96.2 kg)   BMI 37.55 kg/m   Physical Exam  Constitutional: She is oriented to person, place, and time. She appears well-developed and well-nourished. No distress.  Cardiovascular: Normal rate and intact distal pulses.   Neurological: She is alert and oriented to person,  place, and time. She has normal reflexes. She exhibits normal muscle tone. Coordination normal.  Skin: Skin is warm and dry. No rash noted. She is not diaphoretic. No erythema. No pallor.  Psychiatric: She has a normal mood and affect. Her behavior is normal. Judgment and thought content normal.    Ortho Exam left foot tenderness and swelling over the fifth metatarsal. Tibia knee normal to palpation. Range of motion at the ankle joint normal. Ankle stable. Knee stable. Strength normal. Atrophy none. Skin normal. Normal dorsalis pedis pulse. Normal sensation.  She is ambulating with a limp  Opposite foot pain tenderness or swelling ASSESSMENT: My personal  interpretation of the images:  Pseudo-Jones fracture left foot transverse fracture.    PLAN Short Cam Walker for 6 weeks x-ray 6 weeks  Weightbearing as tolerated  Arther Abbott, MD 08/18/2016 2:30 PM  Meds ordered this encounter  Medications  . DISCONTD: HYDROcodone-acetaminophen (NORCO/VICODIN) 5-325 MG tablet    Sig: Take 1 tablet by mouth every 6 (six) hours as needed for moderate pain.    Dispense:  30 tablet    Refill:  0

## 2016-08-24 ENCOUNTER — Telehealth: Payer: Self-pay | Admitting: Orthopedic Surgery

## 2016-08-24 NOTE — Telephone Encounter (Signed)
PATIENT CAME INTO OFFICE FOR BRACE TODAY

## 2016-08-24 NOTE — Telephone Encounter (Signed)
Patient called to ask about status of special order/size of brace/cam walker?  [phone] (Home)/[phone] (Mobile)

## 2016-09-22 ENCOUNTER — Ambulatory Visit: Payer: BLUE CROSS/BLUE SHIELD | Admitting: Orthopedic Surgery

## 2016-09-29 ENCOUNTER — Ambulatory Visit (INDEPENDENT_AMBULATORY_CARE_PROVIDER_SITE_OTHER): Payer: BLUE CROSS/BLUE SHIELD | Admitting: Orthopedic Surgery

## 2016-09-29 ENCOUNTER — Encounter: Payer: Self-pay | Admitting: Orthopedic Surgery

## 2016-09-29 ENCOUNTER — Ambulatory Visit (INDEPENDENT_AMBULATORY_CARE_PROVIDER_SITE_OTHER): Payer: BLUE CROSS/BLUE SHIELD

## 2016-09-29 DIAGNOSIS — S92352A Displaced fracture of fifth metatarsal bone, left foot, initial encounter for closed fracture: Secondary | ICD-10-CM

## 2016-09-29 NOTE — Progress Notes (Signed)
Patient ID: Emma Pittman, female   DOB: 1961/08/19, 55 y.o.   MRN: [id number]  Fracture follow-up  Chief Complaint  Patient presents with  . Follow-up    left metatarsal fracture 08/10/16      Tenderness over the fifth metatarsal with normal alignment of the foot  X-ray shows partial fracture healing  Recommend 3 week follow-up  Continue weightbearing and short Cam Walker except in the house.  X-ray next visit

## 2016-10-20 ENCOUNTER — Ambulatory Visit (INDEPENDENT_AMBULATORY_CARE_PROVIDER_SITE_OTHER): Payer: BLUE CROSS/BLUE SHIELD

## 2016-10-20 ENCOUNTER — Ambulatory Visit (INDEPENDENT_AMBULATORY_CARE_PROVIDER_SITE_OTHER): Payer: BLUE CROSS/BLUE SHIELD | Admitting: Orthopedic Surgery

## 2016-10-20 DIAGNOSIS — S92352A Displaced fracture of fifth metatarsal bone, left foot, initial encounter for closed fracture: Secondary | ICD-10-CM | POA: Diagnosis not present

## 2016-10-20 NOTE — Progress Notes (Signed)
Patient ID: Emma Pittman, female   DOB: 25-Nov-1961, 55 y.o.   MRN: [id number]  Chief Complaint  Patient presents with  . Follow-up    Recheck on left foot fracture with xray, DOI 08-10-16.    HPI Emma Pittman is a 55 y.o. female.   HPI  10 WEEKS POST FRACTURE  Left fifth metatarsal fracture proximal, Cam Walker. Comes in for repeat x-ray  Review of Systems Review of Systems   Physical Exam There were no vitals taken for this visit.   Physical Exam  No tenderness to palpation over the fracture site is no malalignment in the foot. X-ray shows probable fibrous union across 40% of the fracture and the other 60% has bony union  Impression healed proximal left fifth metatarsal fracture

## 2017-03-29 ENCOUNTER — Other Ambulatory Visit: Payer: 59 | Admitting: Adult Health

## 2017-04-15 ENCOUNTER — Emergency Department (HOSPITAL_COMMUNITY)
Admission: EM | Admit: 2017-04-15 | Discharge: 2017-04-15 | Disposition: A | Discharge status: Home / Self Care | Payer: BLUE CROSS/BLUE SHIELD | Attending: Emergency Medicine | Admitting: Emergency Medicine

## 2017-04-15 ENCOUNTER — Emergency Department (HOSPITAL_COMMUNITY): Payer: BLUE CROSS/BLUE SHIELD

## 2017-04-15 ENCOUNTER — Encounter (HOSPITAL_COMMUNITY): Payer: Self-pay

## 2017-04-15 DIAGNOSIS — I10 Essential (primary) hypertension: Secondary | ICD-10-CM | POA: Diagnosis not present

## 2017-04-15 DIAGNOSIS — Z79899 Other long term (current) drug therapy: Secondary | ICD-10-CM | POA: Diagnosis not present

## 2017-04-15 DIAGNOSIS — R102 Pelvic and perineal pain: Secondary | ICD-10-CM | POA: Diagnosis not present

## 2017-04-15 DIAGNOSIS — Z87891 Personal history of nicotine dependence: Secondary | ICD-10-CM | POA: Insufficient documentation

## 2017-04-15 DIAGNOSIS — R110 Nausea: Secondary | ICD-10-CM | POA: Insufficient documentation

## 2017-04-15 LAB — CBC WITH DIFFERENTIAL/PLATELET
Basophils Absolute: 0 10*3/uL (ref 0.0–0.1)
Basophils Relative: 0 %
Eosinophils Absolute: 0.2 10*3/uL (ref 0.0–0.7)
Eosinophils Relative: 2 %
HCT: 38.6 % (ref 36.0–46.0)
Hemoglobin: 13.3 g/dL (ref 12.0–15.0)
Lymphocytes Relative: 18 %
Lymphs Abs: 1.9 10*3/uL (ref 0.7–4.0)
MCH: 29.4 pg (ref 26.0–34.0)
MCHC: 34.5 g/dL (ref 30.0–36.0)
MCV: 85.2 fL (ref 78.0–100.0)
Monocytes Absolute: 0.7 10*3/uL (ref 0.1–1.0)
Monocytes Relative: 7 %
Neutro Abs: 8 10*3/uL — ABNORMAL HIGH (ref 1.7–7.7)
Neutrophils Relative %: 73 %
Platelets: 230 10*3/uL (ref 150–400)
RBC: 4.53 MIL/uL (ref 3.87–5.11)
RDW: 13.2 % (ref 11.5–15.5)
WBC: 10.8 10*3/uL — ABNORMAL HIGH (ref 4.0–10.5)

## 2017-04-15 LAB — COMPREHENSIVE METABOLIC PANEL
ALT: 18 U/L (ref 14–54)
AST: 26 U/L (ref 15–41)
Albumin: 4 g/dL (ref 3.5–5.0)
Alkaline Phosphatase: 62 U/L (ref 38–126)
Anion gap: 10 (ref 5–15)
BUN: 18 mg/dL (ref 6–20)
CO2: 20 mmol/L — ABNORMAL LOW (ref 22–32)
Calcium: 9.5 mg/dL (ref 8.9–10.3)
Chloride: 107 mmol/L (ref 101–111)
Creatinine, Ser: 0.72 mg/dL (ref 0.44–1.00)
GFR calc Af Amer: >60 mL/min (ref >60)
GFR calc non Af Amer: >60 mL/min (ref >60)
Glucose, Bld: 115 mg/dL — ABNORMAL HIGH (ref 65–99)
Potassium: 3.1 mmol/L — ABNORMAL LOW (ref 3.5–5.1)
Sodium: 137 mmol/L (ref 135–145)
Total Bilirubin: 0.8 mg/dL (ref 0.3–1.2)
Total Protein: 7.3 g/dL (ref 6.5–8.1)

## 2017-04-15 LAB — URINALYSIS, ROUTINE W REFLEX MICROSCOPIC
Bilirubin Urine: NEGATIVE
Glucose, UA: NEGATIVE mg/dL
Hgb urine dipstick: NEGATIVE
Ketones, ur: NEGATIVE mg/dL
Leukocytes, UA: NEGATIVE
Nitrite: NEGATIVE
Protein, ur: NEGATIVE mg/dL
Specific Gravity, Urine: 1.013 (ref 1.005–1.030)
pH: 7 (ref 5.0–8.0)

## 2017-04-15 LAB — WET PREP, GENITAL
Sperm: NONE SEEN
Trich, Wet Prep: NONE SEEN
WBC, Wet Prep HPF POC: NONE SEEN
Yeast Wet Prep HPF POC: NONE SEEN

## 2017-04-15 LAB — PREGNANCY, URINE: Preg Test, Ur: NEGATIVE

## 2017-04-15 MED ORDER — IOPAMIDOL (ISOVUE-300) INJECTION 61%
100.0000 mL | Freq: Once | INTRAVENOUS | Status: AC | PRN
  Administered 2017-04-15: 100 mL via INTRAVENOUS

## 2017-04-15 MED ORDER — SODIUM CHLORIDE 0.9 % IV BOLUS (SEPSIS)
1000.0000 mL | Freq: Once | INTRAVENOUS | Status: AC
  Administered 2017-04-15: 1000 mL via INTRAVENOUS

## 2017-04-15 MED ORDER — KETOROLAC TROMETHAMINE 30 MG/ML IJ SOLN
30.0000 mg | Freq: Once | INTRAMUSCULAR | Status: AC
  Administered 2017-04-15: 30 mg via INTRAVENOUS
  Filled 2017-04-15: qty 1

## 2017-04-15 MED ORDER — HYDROMORPHONE HCL 1 MG/ML IJ SOLN
1.0000 mg | Freq: Once | INTRAMUSCULAR | Status: AC
  Administered 2017-04-15: 1 mg via INTRAVENOUS
  Filled 2017-04-15: qty 1

## 2017-04-15 MED ORDER — ONDANSETRON HCL 4 MG/2ML IJ SOLN
4.0000 mg | Freq: Once | INTRAMUSCULAR | Status: AC
  Administered 2017-04-15: 4 mg via INTRAVENOUS
  Filled 2017-04-15: qty 2

## 2017-04-15 MED ORDER — IBUPROFEN 800 MG PO TABS: 800.0000 mg | ORAL_TABLET | Freq: Three times a day (TID) | ORAL | 0 refills | Status: DC

## 2017-04-15 MED ORDER — HYDROCODONE-ACETAMINOPHEN 5-325 MG PO TABS: 1.0000 | ORAL_TABLET | ORAL | 0 refills | Status: DC | PRN

## 2017-04-15 MED ORDER — IOPAMIDOL (ISOVUE-300) INJECTION 61%
INTRAVENOUS | Status: AC
  Administered 2017-04-15: 30 mL
  Filled 2017-04-15: qty 30

## 2017-04-15 NOTE — ED Triage Notes (Signed)
Cramping lower abd pain onset Thurs morning, worse tonight.  Nausea, no vomiting or diarrhea. Last Bm Thurs morning was normal.  No bleeding or vag discharge

## 2017-04-15 NOTE — ED Provider Notes (Signed)
[facility] DEPT Provider Note   CSN: [id number] Arrival date & time: 04/15/17  0231     History   Chief Complaint Chief Complaint  Patient presents with  . Abdominal Pain    HPI Emma Pittman is a 56 y.o. female.  Patient presents with complaints of lower abdominal pain. She reports that she was having some cramping yesterday during the day, but after she went to bed the pain worsened. She has now experiencing severe, sharp and stabbing pains across her lower abdomen. She has had nausea but no vomiting. She had a normal bowel movement yesterday evening, has not had any diarrhea or rectal bleeding. She denies urinary symptoms.      Past Medical History:  Diagnosis Date  . Hemorrhoids 03/22/2016  . Hypercholesteremia   . Hypertension   . PONV (postoperative nausea and vomiting)   . Spotting 03/06/2014  . Vertigo   . Vitiligo     Patient Active Problem List   Diagnosis Date Noted  . Hemorrhoids 03/22/2016  . Vitiligo 03/22/2016  . Spotting 03/06/2014    Past Surgical History:  Procedure Laterality Date  . APPENDECTOMY  age 89  . CATARACT EXTRACTION W/PHACO  08/19/2011   Procedure: CATARACT EXTRACTION PHACO AND INTRAOCULAR LENS PLACEMENT (IOC);  Surgeon: Tonny Branch;  Location: [facility];  Service: Ophthalmology;  Laterality: Right;  CDE:9.50  . CATARACT EXTRACTION W/PHACO  09/06/2011   Procedure: CATARACT EXTRACTION PHACO AND INTRAOCULAR LENS PLACEMENT (IOC);  Surgeon: Tonny Branch;  Location: [facility];  Service: Ophthalmology;  Laterality: Left;  CDE: 5.25  . COLONOSCOPY    . ENDOMETRIAL ABLATION  8 yrs ago-eure   aph  . TUBAL LIGATION  15 yrs & 10 yrs    aph-Dr Ferguson:also had dye studies of tubes with opening of one tube    OB History    Gravida Para Term Preterm AB Living   1 1       1    SAB TAB Ectopic Multiple Live Births           1       Home Medications    Prior to Admission medications   Medication Sig Start Date End Date Taking? Authorizing  Provider  acetaminophen (TYLENOL) 325 MG tablet Take 325 mg by mouth every 6 (six) hours as needed. For pain    Yes Historical Provider, MD  atorvastatin (LIPITOR) 10 MG tablet Take 10 mg by mouth every morning.    Yes Historical Provider, MD  lisinopril-hydrochlorothiazide (PRINZIDE,ZESTORETIC) 10-12.5 MG per tablet Take 1 tablet by mouth every morning.    Yes Historical Provider, MD  HYDROcodone-acetaminophen (NORCO/VICODIN) 5-325 MG tablet Take 1-2 tablets by mouth every 4 (four) hours as needed for moderate pain. 04/15/17   Orpah Greek, MD  ibuprofen (ADVIL,MOTRIN) 800 MG tablet Take 1 tablet (800 mg total) by mouth 3 (three) times daily. 04/15/17   Orpah Greek, MD    Family History Family History  Problem Relation Age of Onset  . Heart attack Mother   . Cancer Mother     colon  . Kidney failure Father   . Congestive Heart Failure Father   . Congestive Heart Failure Brother   . Anesthesia problems Neg Hx   . Hypotension Neg Hx   . Malignant hyperthermia Neg Hx   . Pseudochol deficiency Neg Hx     Social History Social History  Substance Use Topics  . Smoking status: Former Smoker    Packs/day: 0.50    Years:  4.00    Types: Cigarettes    Quit date: 08/12/1991  . Smokeless tobacco: Never Used  . Alcohol use Yes     Comment: occassional     Allergies   Patient has no known allergies.   Review of Systems Review of Systems  Gastrointestinal: Positive for abdominal pain and nausea.  All other systems reviewed and are negative.    Physical Exam Updated Vital Signs BP 133/80 (BP Location: Right Arm)   Pulse 79   Temp 97.9 F (36.6 C) (Oral)   Resp 18   Ht 5\' 2"  (1.575 m)   Wt 198 lb (89.8 kg)   SpO2 99%   BMI 36.21 kg/m   Physical Exam  Constitutional: She is oriented to person, place, and time. She appears well-developed and well-nourished. No distress.  HENT:  Head: Normocephalic and atraumatic.  Right Ear: Hearing normal.  Left Ear:  Hearing normal.  Nose: Nose normal.  Mouth/Throat: Oropharynx is clear and moist and mucous membranes are normal.  Eyes: Conjunctivae and EOM are normal. Pupils are equal, round, and reactive to light.  Neck: Normal range of motion. Neck supple.  Cardiovascular: Regular rhythm, S1 normal and S2 normal.  Exam reveals no gallop and no friction rub.   No murmur heard. Pulmonary/Chest: Effort normal and breath sounds normal. No respiratory distress. She exhibits no tenderness.  Abdominal: Soft. Normal appearance and bowel sounds are normal. There is no hepatosplenomegaly. There is tenderness in the right lower quadrant, suprapubic area and left lower quadrant. There is no rebound, no guarding, no tenderness at McBurney's point and negative Murphy's sign. No hernia. Hernia confirmed negative in the right inguinal area and confirmed negative in the left inguinal area.  Genitourinary: Vagina normal. Uterus is tender. Cervix exhibits no motion tenderness, no discharge and no friability. Right adnexum displays no mass, no tenderness and no fullness. Left adnexum displays no mass, no tenderness and no fullness.  Musculoskeletal: Normal range of motion.  Neurological: She is alert and oriented to person, place, and time. She has normal strength. No cranial nerve deficit or sensory deficit. Coordination normal. GCS eye subscore is 4. GCS verbal subscore is 5. GCS motor subscore is 6.  Skin: Skin is warm, dry and intact. No rash noted. No cyanosis.  Psychiatric: She has a normal mood and affect. Her speech is normal and behavior is normal. Thought content normal.  Nursing note and vitals reviewed.    ED Treatments / Results  Labs (all labs ordered are listed, but only abnormal results are displayed) Labs Reviewed  CBC WITH DIFFERENTIAL/PLATELET - Abnormal; Notable for the following:       Result Value   WBC 10.8 (*)    Neutro Abs 8.0 (*)    All other components within normal limits  COMPREHENSIVE  METABOLIC PANEL - Abnormal; Notable for the following:    Potassium 3.1 (*)    CO2 20 (*)    Glucose, Bld 115 (*)    All other components within normal limits  WET PREP, GENITAL  URINALYSIS, ROUTINE W REFLEX MICROSCOPIC  PREGNANCY, URINE  GC/CHLAMYDIA PROBE AMP ([place]) NOT AT [facility]    EKG  EKG Interpretation None       Radiology Ct Abdomen Pelvis W Contrast  Result Date: 04/15/2017 CLINICAL DATA:  Cramping lower abdominal pain, onset yesterday morning. Worsened tonight. EXAM: CT ABDOMEN AND PELVIS WITH CONTRAST TECHNIQUE: Multidetector CT imaging of the abdomen and pelvis was performed using the standard protocol following bolus administration of  intravenous contrast. CONTRAST:  115mL ISOVUE-300 IOPAMIDOL (ISOVUE-300) INJECTION 61%, 141mL ISOVUE-300 IOPAMIDOL (ISOVUE-300) INJECTION 61% COMPARISON:  08/22/2014 FINDINGS: Lower chest: No acute abnormality. Hepatobiliary: No focal liver abnormality is seen. No gallstones, gallbladder wall thickening, or biliary dilatation. Pancreas: Unremarkable. No pancreatic ductal dilatation or surrounding inflammatory changes. Spleen: Normal in size without focal abnormality. Adrenals/Urinary Tract: Adrenal glands are unremarkable. Kidneys are normal, without renal calculi, focal lesion, or hydronephrosis. Bladder is unremarkable. Stomach/Bowel: Stomach is within normal limits. Appendectomy. Colon appears normal. No evidence of bowel wall thickening, distention, or inflammatory changes. Vascular/Lymphatic: Mild atherosclerotic calcification of the normal caliber aorta. No other significant vascular findings are present. No enlarged abdominal or pelvic lymph nodes. Reproductive: Uterus and bilateral adnexa are unremarkable. Other: No acute inflammation. No ascites. Small fat containing umbilical hernia. Musculoskeletal: No significant skeletal lesion. IMPRESSION: No acute inflammation. No ascites. Small fat containing umbilical hernia. Mild aortic  atherosclerosis. Electronically Signed   By: Andreas Newport M.D.   On: 04/15/2017 04:33    Procedures Procedures (including critical care time)  Medications Ordered in ED Medications  ketorolac (TORADOL) 30 MG/ML injection 30 mg (not administered)  HYDROmorphone (DILAUDID) injection 1 mg (1 mg Intravenous Given 04/15/17 0327)  ondansetron (ZOFRAN) injection 4 mg (4 mg Intravenous Given 04/15/17 0326)  sodium chloride 0.9 % bolus 1,000 mL (1,000 mLs Intravenous New Bag/Given 04/15/17 0325)  iopamidol (ISOVUE-300) 61 % injection (30 mLs  Contrast Given 04/15/17 0410)  iopamidol (ISOVUE-300) 61 % injection 100 mL (100 mLs Intravenous Contrast Given 04/15/17 0410)     Initial Impression / Assessment and Plan / ED Course  I have reviewed the triage vital signs and the nursing notes.  Pertinent labs & imaging results that were available during my care of the patient were reviewed by me and considered in my medical decision making (see chart for details).     Patient presents to the emergency department with complaints of severe low abdominal pain and cramping. She reports that it started as mild cramping. She has a history of endometrial ablation. She reports that she does not have menstrual periods, but does have occasional cramps that are like her. Used to be. The pain earlier today began like that, but then intensified tonight. She was having severe pain in the lower abdomen and pelvic area. Examination revealed mild diffuse tenderness in this area. No signs of peritonitis. Blood work normal. Urinalysis unremarkable. CT scan performed. No evidence of colitis/diverticulitis. No abnormality noted on CT. Pelvic exam did not reveal any tenderness or masses in the area of the adnexa. Do not suspect ovarian torsion. She does have tenderness of the uterus. At this point I do not feel she has an acute emergent medical condition that requires further evaluation. She was treated with analgesia and will follow-up  with OB/GYN.  Final Clinical Impressions(s) / ED Diagnoses   Final diagnoses:  Pelvic pain in female    New Prescriptions New Prescriptions   HYDROCODONE-ACETAMINOPHEN (NORCO/VICODIN) 5-325 MG TABLET    Take 1-2 tablets by mouth every 4 (four) hours as needed for moderate pain.   IBUPROFEN (ADVIL,MOTRIN) 800 MG TABLET    Take 1 tablet (800 mg total) by mouth 3 (three) times daily.     Orpah Greek, MD 04/15/17 [id number]

## 2017-04-15 NOTE — ED Notes (Signed)
Pt states understanding of care given and follow up instructions.  Do not drive or drink ETOH while taking prescription medications.  Follow up with OBGYN as scheduled.  Pt a/o, taken from ED in wheelchair, SO to transport home.

## 2017-04-26 ENCOUNTER — Encounter: Payer: Self-pay | Admitting: Adult Health

## 2017-04-26 ENCOUNTER — Other Ambulatory Visit: Payer: Self-pay | Admitting: Adult Health

## 2017-04-26 ENCOUNTER — Other Ambulatory Visit (HOSPITAL_COMMUNITY)
Admission: RE | Admit: 2017-04-26 | Discharge: 2017-04-26 | Disposition: A | Discharge status: Home / Self Care | Payer: BLUE CROSS/BLUE SHIELD | Source: Ambulatory Visit | Attending: Adult Health | Admitting: Adult Health

## 2017-04-26 ENCOUNTER — Ambulatory Visit (INDEPENDENT_AMBULATORY_CARE_PROVIDER_SITE_OTHER): Payer: BLUE CROSS/BLUE SHIELD | Admitting: Adult Health

## 2017-04-26 VITALS — BP 134/84 | HR 74 | Ht 62.0 in | Wt 204.0 lb

## 2017-04-26 DIAGNOSIS — Z1231 Encounter for screening mammogram for malignant neoplasm of breast: Secondary | ICD-10-CM

## 2017-04-26 DIAGNOSIS — Z1211 Encounter for screening for malignant neoplasm of colon: Secondary | ICD-10-CM | POA: Diagnosis not present

## 2017-04-26 DIAGNOSIS — Z1212 Encounter for screening for malignant neoplasm of rectum: Secondary | ICD-10-CM | POA: Diagnosis not present

## 2017-04-26 DIAGNOSIS — L80 Vitiligo: Secondary | ICD-10-CM

## 2017-04-26 DIAGNOSIS — Z01419 Encounter for gynecological examination (general) (routine) without abnormal findings: Secondary | ICD-10-CM | POA: Insufficient documentation

## 2017-04-26 LAB — HEMOCCULT GUIAC POC 1CARD (OFFICE): Fecal Occult Blood, POC: NEGATIVE

## 2017-04-26 NOTE — Progress Notes (Signed)
Patient ID: Emma Pittman, female   DOB: 01/21/1961, 56 y.o.   MRN: [id number] History of Present Illness: Emma Pittman is a 56 year old white female, married in for well woman gyn exam and pap.She had pain/cramps, almost like having a baby about 2 weeks ago and was seen in ER at [facility], CT showed small umbilical hernia but otherwise normal.Has no pain since. PCP is [facility].    Current Medications, Allergies, Past Medical History, Past Surgical History, Family History and Social History were reviewed in [employer] record.     Review of Systems: Patient denies any headaches, hearing loss, fatigue, blurred vision, shortness of breath, chest pain, abdominal pain, problems with bowel movements, urination, or intercourse. No joint pain or mood swings. Has some family stress, husband not well.    Physical Exam:BP 134/84 (BP Location: Right Arm, Patient Position: Sitting, Cuff Size: Normal)   Pulse 74   Ht 5\' 2"  (1.575 m)   Wt 204 lb (92.5 kg)   BMI 37.31 kg/m  General:  Well developed, well nourished, no acute distress Skin:  Warm and dry,has vitiligo under breasts and in peri area Neck:  Midline trachea, normal thyroid, good ROM, no lymphadenopathy Lungs; Clear to auscultation bilaterally Breast:  No dominant palpable mass, retraction, or nipple discharge Cardiovascular: Regular rate and rhythm Abdomen:  Soft, non tender, no hepatosplenomegaly Pelvic:  External genitalia is normal in appearance, no lesions.  The vagina is normal in appearance. Urethra has no lesions or masses. The cervix is bulbous.Thin prep pap with HPV performed.  Uterus is felt to be normal size, shape, and contour.  No adnexal masses or tenderness noted.Bladder is non tender, no masses felt. Rectal: Good sphincter tone, no polyps, or hemorrhoids felt.  Hemoccult negative. Extremities/musculoskeletal:  No swelling or varicosities noted, no clubbing or cyanosis Psych:  No mood changes,  alert and cooperative,seems happy PHQ 2 score 0.  Impression: 1. Pap smear, as part of routine gynecological examination   2. Screening for colorectal cancer   3. Vitiligo       Plan: Physical in 1 year Pap in 3 if normal Mammogram yearly Labs with PCP

## 2017-04-27 ENCOUNTER — Ambulatory Visit (HOSPITAL_COMMUNITY)
Admission: RE | Admit: 2017-04-27 | Discharge: 2017-04-27 | Disposition: A | Discharge status: Home / Self Care | Payer: BLUE CROSS/BLUE SHIELD | Source: Ambulatory Visit | Attending: Adult Health | Admitting: Adult Health

## 2017-04-27 DIAGNOSIS — Z1231 Encounter for screening mammogram for malignant neoplasm of breast: Secondary | ICD-10-CM | POA: Diagnosis not present

## 2017-04-27 LAB — CYTOLOGY - PAP
Adequacy: ABSENT
Diagnosis: NEGATIVE
HPV: NOT DETECTED

## 2018-04-14 ENCOUNTER — Other Ambulatory Visit: Payer: Self-pay | Admitting: Adult Health

## 2018-04-14 DIAGNOSIS — Z1231 Encounter for screening mammogram for malignant neoplasm of breast: Secondary | ICD-10-CM

## 2018-04-28 ENCOUNTER — Encounter (HOSPITAL_COMMUNITY): Payer: Self-pay

## 2018-04-28 ENCOUNTER — Ambulatory Visit (HOSPITAL_COMMUNITY)
Admission: RE | Admit: 2018-04-28 | Discharge: 2018-04-28 | Disposition: A | Discharge status: Home / Self Care | Payer: BLUE CROSS/BLUE SHIELD | Source: Ambulatory Visit | Attending: Adult Health | Admitting: Adult Health

## 2018-04-28 DIAGNOSIS — Z1231 Encounter for screening mammogram for malignant neoplasm of breast: Secondary | ICD-10-CM | POA: Diagnosis not present

## 2018-06-07 IMAGING — CT CT ABD-PELV W/ CM
2 of 4 series · 16 of 46 positions shown, 18 images · IV contrast (Isovue)
Comparison: 08/22/2014

CLINICAL DATA: Cramping lower abdominal pain, onset yesterday
morning. Worsened tonight.

EXAM:
CT ABDOMEN AND PELVIS WITH CONTRAST
TECHNIQUE: Multidetector CT imaging of the abdomen and pelvis was performed
using the standard protocol following bolus administration of
intravenous contrast.
CONTRAST:  130mL OO4Y1T-400 IOPAMIDOL (OO4Y1T-400) INJECTION 61%,
100mL OO4Y1T-400 IOPAMIDOL (OO4Y1T-400) INJECTION 61%

[Series 2: axial st · axial · 0.98mm/px · z∈[-424,-28]mm · 13 of 89 slices shown, 15 images]
[im 5/89  soft-tissue]
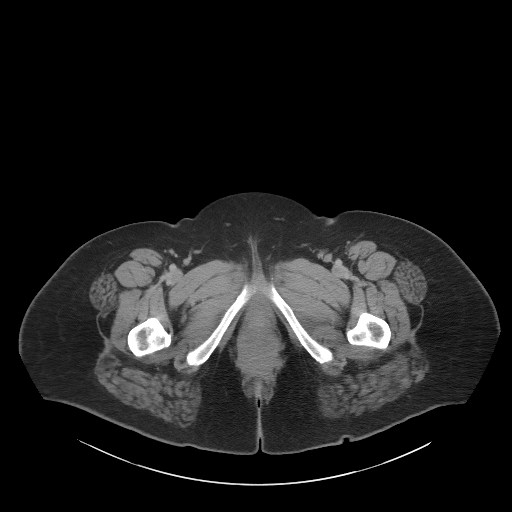
[im 5/89  bone]
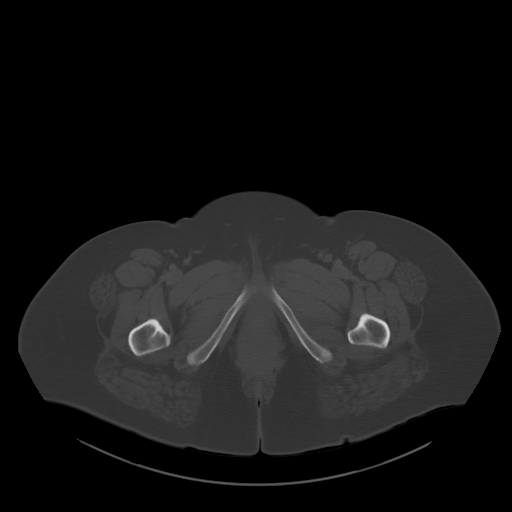
[im 13/89  soft-tissue]
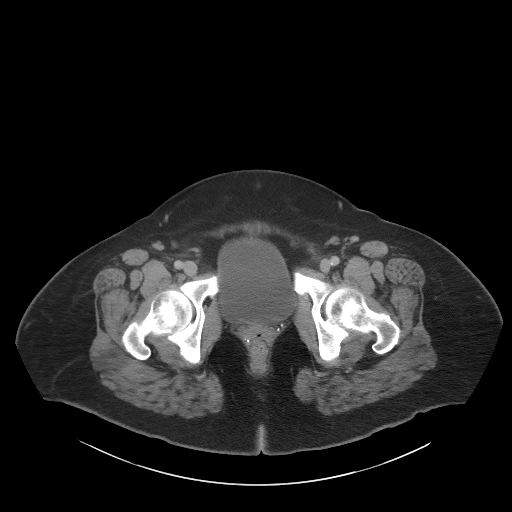
[im 17/89  soft-tissue]
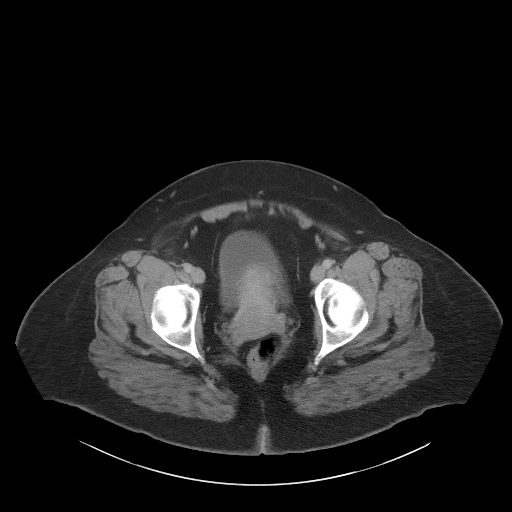
[im 26/89  soft-tissue]
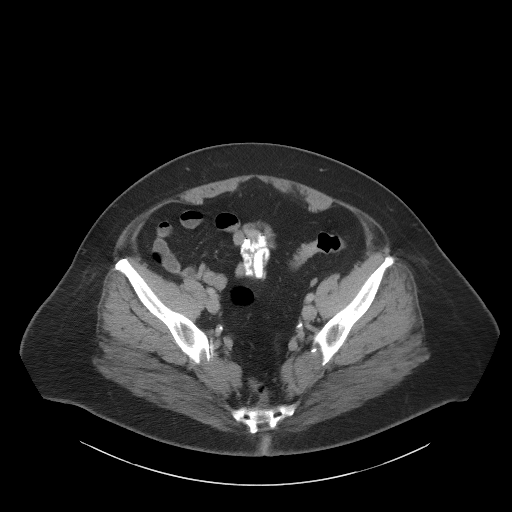
[im 30/89  soft-tissue]
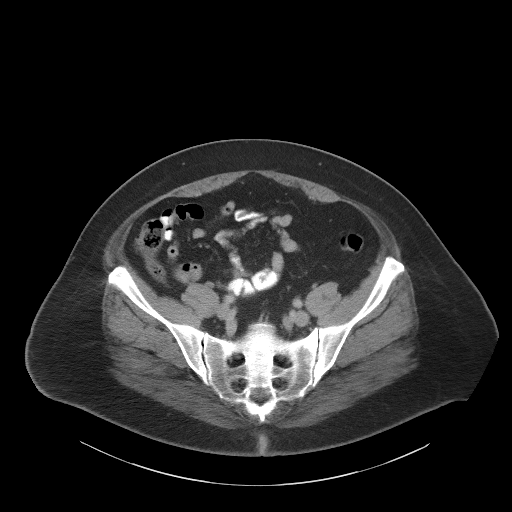
[im 38/89  soft-tissue]
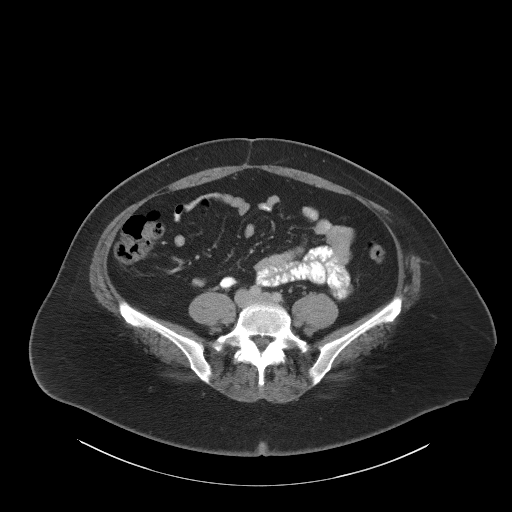
[im 47/89  soft-tissue]
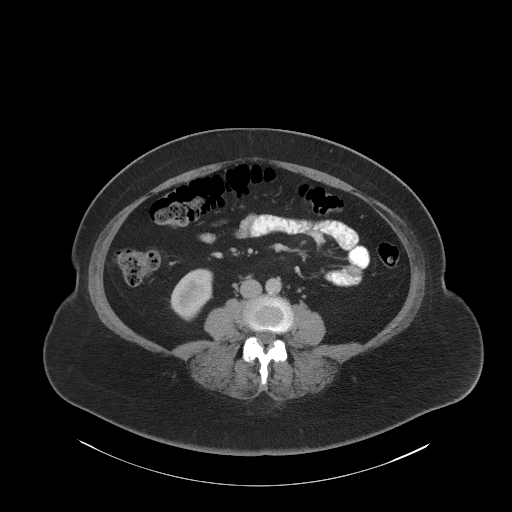
[im 51/89  soft-tissue]
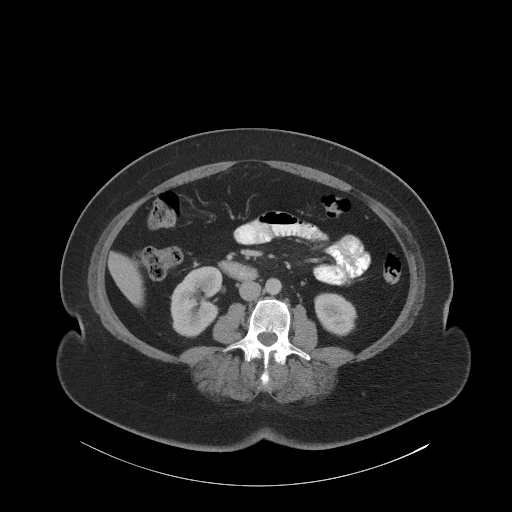
[im 59/89  soft-tissue]
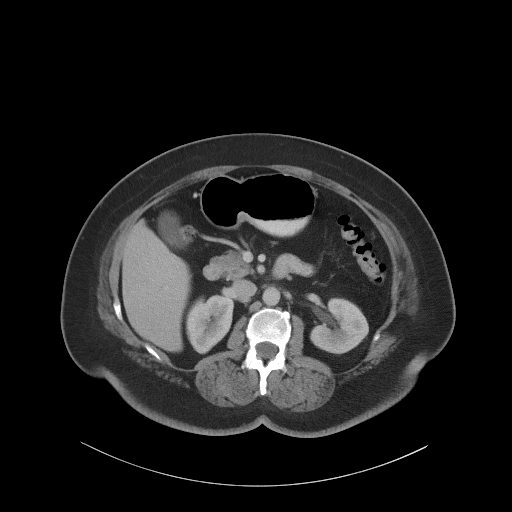
[im 59/89  bone]
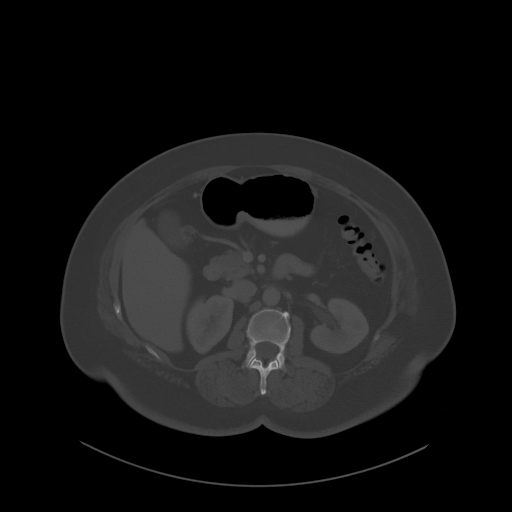
[im 63/89  soft-tissue]
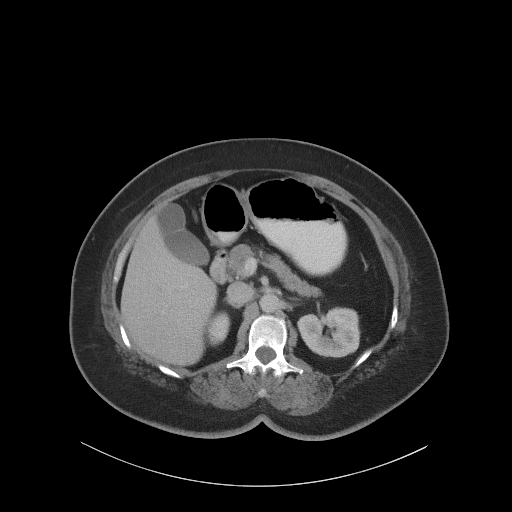
[im 72/89  soft-tissue]
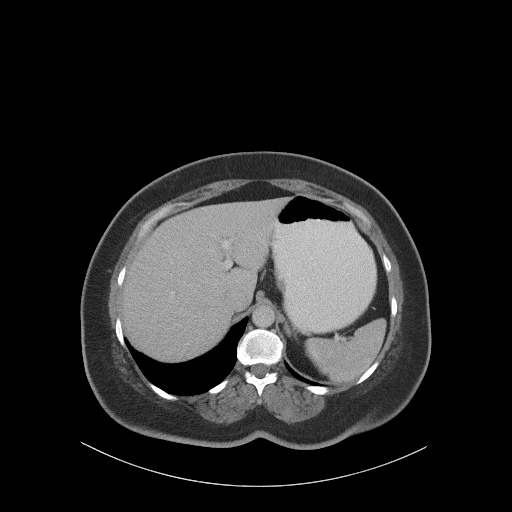
[im 76/89  soft-tissue]
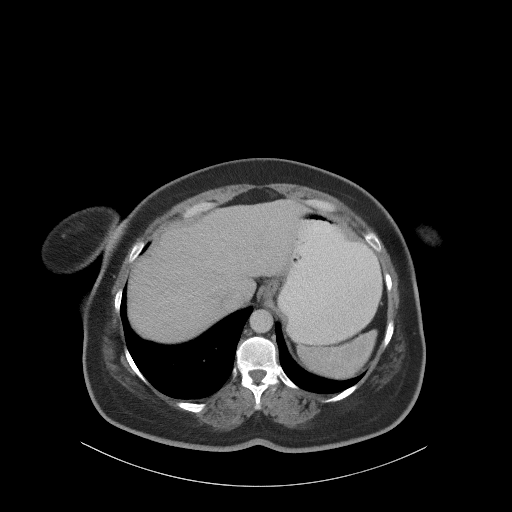
[im 84/89  soft-tissue]
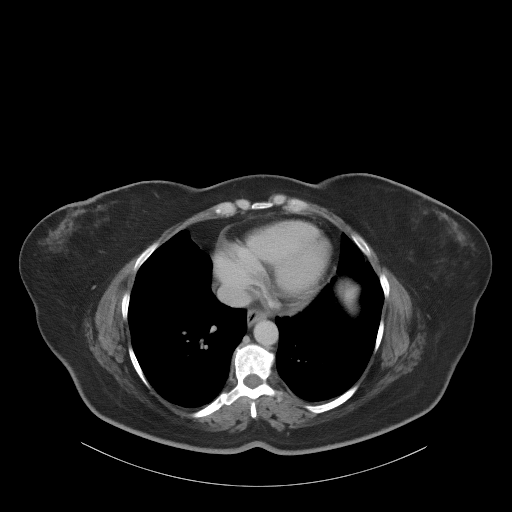

[Series 5: coronal st · coronal · 0.77mm/px · 3 of 95 slices shown]
[im 32/95  soft-tissue]
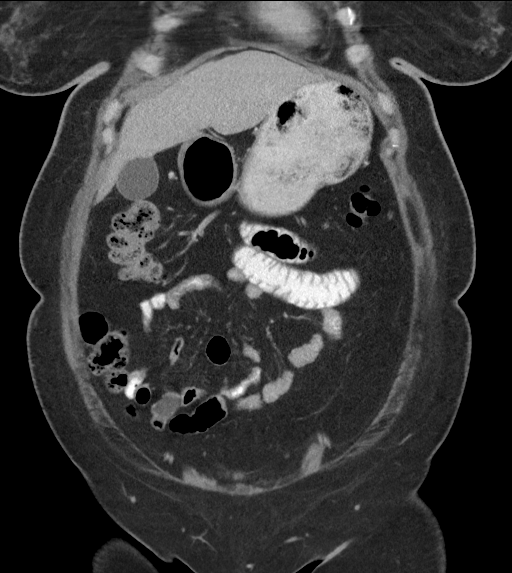
[im 42/95  soft-tissue]
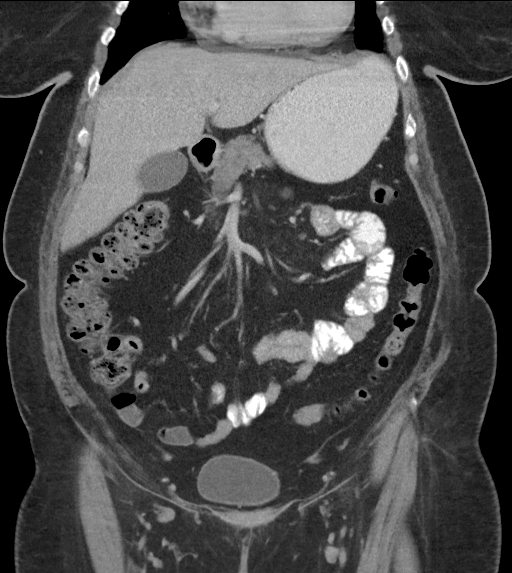
[im 53/95  soft-tissue]
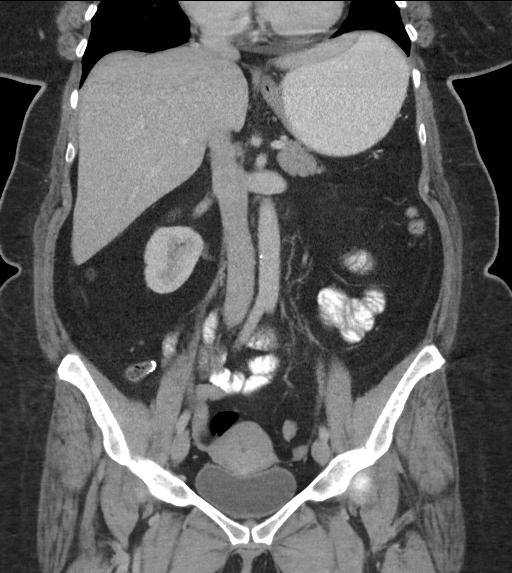

[16 of 46 positions shown; findings below may reference images not displayed]

FINDINGS: Lower chest: No acute abnormality.

Hepatobiliary: No focal liver abnormality is seen. No gallstones,
gallbladder wall thickening, or biliary dilatation.

Pancreas: Unremarkable. No pancreatic ductal dilatation or
surrounding inflammatory changes.

Spleen: Normal in size without focal abnormality.

Adrenals/Urinary Tract: Adrenal glands are unremarkable. Kidneys are
normal, without renal calculi, focal lesion, or hydronephrosis.
Bladder is unremarkable.

Stomach/Bowel: Stomach is within normal limits. Appendectomy. Colon
appears normal. No evidence of bowel wall thickening, distention, or
inflammatory changes.

Vascular/Lymphatic: Mild atherosclerotic calcification of the normal
caliber aorta. No other significant vascular findings are present.
No enlarged abdominal or pelvic lymph nodes.

Reproductive: Uterus and bilateral adnexa are unremarkable.

Other: No acute inflammation. No ascites. Small fat containing
umbilical hernia.

Musculoskeletal: No significant skeletal lesion.
IMPRESSION: No acute inflammation. No ascites. Small fat containing umbilical
hernia. Mild aortic atherosclerosis.

## 2018-06-09 ENCOUNTER — Encounter (INDEPENDENT_AMBULATORY_CARE_PROVIDER_SITE_OTHER): Payer: Self-pay

## 2018-06-09 ENCOUNTER — Ambulatory Visit: Payer: BLUE CROSS/BLUE SHIELD | Admitting: Adult Health

## 2018-06-09 ENCOUNTER — Encounter: Payer: Self-pay | Admitting: Adult Health

## 2018-06-09 VITALS — BP 114/68 | HR 85 | Ht 62.0 in | Wt 209.0 lb

## 2018-06-09 DIAGNOSIS — Z1329 Encounter for screening for other suspected endocrine disorder: Secondary | ICD-10-CM | POA: Diagnosis not present

## 2018-06-09 DIAGNOSIS — Z1212 Encounter for screening for malignant neoplasm of rectum: Secondary | ICD-10-CM | POA: Diagnosis not present

## 2018-06-09 DIAGNOSIS — Z1211 Encounter for screening for malignant neoplasm of colon: Secondary | ICD-10-CM | POA: Diagnosis not present

## 2018-06-09 DIAGNOSIS — Z01411 Encounter for gynecological examination (general) (routine) with abnormal findings: Secondary | ICD-10-CM

## 2018-06-09 DIAGNOSIS — Z01419 Encounter for gynecological examination (general) (routine) without abnormal findings: Secondary | ICD-10-CM | POA: Insufficient documentation

## 2018-06-09 DIAGNOSIS — Z13220 Encounter for screening for lipoid disorders: Secondary | ICD-10-CM

## 2018-06-09 DIAGNOSIS — L0292 Furuncle, unspecified: Secondary | ICD-10-CM | POA: Insufficient documentation

## 2018-06-09 LAB — HEMOCCULT GUIAC POC 1CARD (OFFICE): Fecal Occult Blood, POC: NEGATIVE

## 2018-06-09 MED ORDER — SILVER SULFADIAZINE 1 % EX CREA: 1.0000 "application " | TOPICAL_CREAM | Freq: Two times a day (BID) | CUTANEOUS | 0 refills | Status: DC

## 2018-06-09 MED ORDER — SULFAMETHOXAZOLE-TRIMETHOPRIM 800-160 MG PO TABS: 1.0000 | ORAL_TABLET | Freq: Two times a day (BID) | ORAL | 1 refills | Status: DC

## 2018-06-09 NOTE — Progress Notes (Signed)
Patient ID: Emma Pittman, female   DOB: [birth year], 57 y.o.   MRN: [id number] History of Present Illness: Emma Pittman is a 57 year old white female, married in for well woman gyn exam, she had normal pap with negative HPV 04/26/17. PCP is Emma Pittman.    Current Medications, Allergies, Past Medical History, Past Surgical History, Family History and Social History were reviewed in [employer] record.     Review of Systems: Patient denies any headaches, hearing loss, blurred vision, shortness of breath, chest pain, abdominal pain, problems with bowel movements, urination, or intercourse. No joint pain or mood swings. Is tired at times, but keeps 2 grand kids several days during the week. Has boil on thigh.   Physical Exam:BP 114/68 (BP Location: Left Arm, Patient Position: Sitting, Cuff Size: Large)   Pulse 85   Ht 5\' 2"  (1.575 m)   Wt 209 lb (94.8 kg)   BMI 38.23 kg/m  General:  Well developed, well nourished, no acute distress Skin:  Warm and dry.+vitilgo  Neck:  Midline trachea, normal thyroid, good ROM, no lymphadenopathy Lungs; Clear to auscultation bilaterally Breast:  No dominant palpable mass, retraction, or nipple discharge Cardiovascular: Regular rate and rhythm Abdomen:  Soft, non tender, no hepatosplenomegaly Pelvic:  External genitalia is normal in appearance, no lesions.  The vagina is normal in appearance. Urethra has no lesions or masses. The cervix is bulbous.  Uterus is felt to be normal size, shape, and contour.  No adnexal masses or tenderness noted.Bladder is non tender, no masses felt. Rectal: Good sphincter tone, no polyps, or hemorrhoids felt.  Hemoccult negative. Extremities/musculoskeletal:  No swelling or varicosities noted, no clubbing or cyanosis.Has boil right posterior thigh,she has band aide on it.  Psych:  No mood changes, alert and cooperative,seems happy PHQ 9 score 2.  She requests labs,it fasting today,  And requests referral  to Dr Oneida Alar for  F/U colonoscopy.  Impression: 1. Encounter for well woman exam with routine gynecological exam   2. Screening for colorectal cancer   3. Boil   4. Screening cholesterol level   5. Screening for thyroid disorder       Plan: Check CBC,CMP,TSH and lipids Referred to Dr Oneida Alar Physical in 1 year Pap in 2021 mammogram yearly  Meds ordered this encounter  Medications  . sulfamethoxazole-trimethoprim (BACTRIM DS,SEPTRA DS) 800-160 MG tablet    Sig: Take 1 tablet by mouth 2 (two) times daily. Take 1 bid    Dispense:  28 tablet    Refill:  1    Order Specific Question:   Supervising Provider    Answer:   EURE, LUTHER H [[id number]]  . silver sulfADIAZINE (SILVADENE) 1 % cream    Sig: Apply 1 application topically 2 (two) times daily.    Dispense:  50 g    Refill:  0    Order Specific Question:   Supervising Provider    Answer:   Tania Ade H [[id number]]  Use warm compresses

## 2018-06-09 NOTE — Patient Instructions (Signed)

## 2018-06-10 LAB — COMPREHENSIVE METABOLIC PANEL
ALT: 16 IU/L (ref 0–32)
AST: 18 IU/L (ref 0–40)
Albumin/Globulin Ratio: 1.6 (ref 1.2–2.2)
Albumin: 4.4 g/dL (ref 3.5–5.5)
Alkaline Phosphatase: 84 IU/L (ref 39–117)
BUN/Creatinine Ratio: 21 (ref 9–23)
BUN: 16 mg/dL (ref 6–24)
Bilirubin Total: 0.8 mg/dL (ref 0.0–1.2)
CO2: 25 mmol/L (ref 20–29)
Calcium: 10 mg/dL (ref 8.7–10.2)
Chloride: 101 mmol/L (ref 96–106)
Creatinine, Ser: 0.76 mg/dL (ref 0.57–1.00)
GFR calc Af Amer: 103 mL/min/{1.73_m2} (ref >59)
GFR calc non Af Amer: 89 mL/min/{1.73_m2} (ref >59)
Globulin, Total: 2.8 g/dL (ref 1.5–4.5)
Glucose: 96 mg/dL (ref 65–99)
Potassium: 3.7 mmol/L (ref 3.5–5.2)
Sodium: 141 mmol/L (ref 134–144)
Total Protein: 7.2 g/dL (ref 6.0–8.5)

## 2018-06-10 LAB — CBC
Hematocrit: 40.4 % (ref 34.0–46.6)
Hemoglobin: 13.5 g/dL (ref 11.1–15.9)
MCH: 28.6 pg (ref 26.6–33.0)
MCHC: 33.4 g/dL (ref 31.5–35.7)
MCV: 86 fL (ref 79–97)
Platelets: 266 10*3/uL (ref 150–450)
RBC: 4.72 x10E6/uL (ref 3.77–5.28)
RDW: 13.7 % (ref 12.3–15.4)
WBC: 8.2 10*3/uL (ref 3.4–10.8)

## 2018-06-10 LAB — LIPID PANEL
Chol/HDL Ratio: 2.8 ratio (ref 0.0–4.4)
Cholesterol, Total: 196 mg/dL (ref 100–199)
HDL: 71 mg/dL (ref >39)
LDL Calculated: 109 mg/dL — ABNORMAL HIGH (ref 0–99)
Triglycerides: 80 mg/dL (ref 0–149)
VLDL Cholesterol Cal: 16 mg/dL (ref 5–40)

## 2018-06-10 LAB — TSH: TSH: 3.43 u[IU]/mL (ref 0.450–4.500)

## 2018-06-12 ENCOUNTER — Telehealth: Payer: Self-pay | Admitting: Adult Health

## 2018-06-12 NOTE — Telephone Encounter (Signed)
Pt aware labs are good 

## 2018-07-06 ENCOUNTER — Ambulatory Visit (INDEPENDENT_AMBULATORY_CARE_PROVIDER_SITE_OTHER): Payer: Self-pay

## 2018-07-06 DIAGNOSIS — Z86010 Personal history of colonic polyps: Secondary | ICD-10-CM

## 2018-07-06 MED ORDER — NA SULFATE-K SULFATE-MG SULF 17.5-3.13-1.6 GM/177ML PO SOLN: 1.0000 | ORAL | 0 refills | Status: DC

## 2018-07-06 NOTE — Patient Instructions (Addendum)
Emma Pittman   [birth year] MRN: [id number]    Procedure Date: 09/08/18 Time to register: 12:00 Place to register: [facility] Procedure Time: 1:00 Scheduled provider: Barney Drain, MD  PREPARATION FOR COLONOSCOPY WITH TRI-LYTE SPLIT PREP  Please notify us immediately if you are diabetic, take iron supplements, or if you are on Coumadin or any other blood thinners.     You will need to purchase 1 fleet enema and 1 box of Bisacodyl '5mg'$  tablets. These are available over the counter and you can purchase them at your pharmacy.     1 DAY BEFORE PROCEDURE:  DATE: 09/07/18   DAY: Thursday  clear liquids the entire day - NO SOLID FOOD.     At 2:00 pm:  Take 2 Bisacodyl tablets.   At 4:00pm:  Start drinking your solution. Make sure you mix well per instructions on the bottle. Try to drink 1 (one) 8 ounce glass every 10-15 minutes until you have consumed HALF the jug. You should complete by 6:00pm.You must keep the left over solution refrigerated until completed next day.  Continue clear liquids. You must drink plenty of clear liquids to prevent dehyration and kidney failure.     DAY OF PROCEDURE:   DATE: 09/08/18   DAY: Friday If you take medications for your heart, blood pressure or breathing, you may take these medications.    Five hours before your procedure time @ 8:00am:  Finish remaining amout of bowel prep, drinking 1 (one) 8 ounce glass every 10-15 minutes until complete. You have two hours to consume remaining prep.   Three hours before your procedure time '@10'$ :00am:  Nothing by mouth.   At least one hour before going to the hospital:  Give yourself one Fleet enema.  You may take your morning medications with sip of water unless we have instructed otherwise.      Please see below for Dietary Information.  CLEAR LIQUIDS INCLUDE:  Water Jello (NOT red in color)   Ice Popsicles (NOT red in color)   Tea (sugar ok, no milk/cream) Powdered fruit flavored drinks   Coffee (sugar ok, no milk/cream) Gatorade/ Lemonade/ Kool-Aid  (NOT red in color)   Juice: apple, white grape, white cranberry Soft drinks  Clear bullion, consomme, broth (fat free beef/chicken/vegetable)  Carbonated beverages (any kind)  Strained chicken noodle soup Hard Candy   Remember: Clear liquids are liquids that will allow you to see your fingers on the other side of a clear glass. Be sure liquids are NOT red in color, and not cloudy, but CLEAR.  DO NOT EAT OR DRINK ANY OF THE FOLLOWING:  Dairy products of any kind   Cranberry juice Tomato juice / V8 juice   Grapefruit juice Orange juice     Red grape juice  Do not eat any solid foods, including such foods as: cereal, oatmeal, yogurt, fruits, vegetables, creamed soups, eggs, bread, crackers, pureed foods in a blender, etc.   HELPFUL HINTS FOR DRINKING PREP SOLUTION:   Make sure prep is extremely cold. Mix and refrigerate the the morning of the prep. You may also put in the freezer.   You may try mixing some Crystal Light or Country Time Lemonade if you prefer. Mix in small amounts; add more if necessary.  Try drinking through a straw  Rinse mouth with water or a mouthwash between glasses, to remove after-taste.  Try sipping on a cold beverage /ice/ popsicles between glasses of prep.  Place a piece of sugar-free hard  candy in mouth between glasses.  If you become nauseated, try consuming smaller amounts, or stretch out the time between glasses. Stop for 30-60 minutes, then slowly start back drinking.        OTHER INSTRUCTIONS  You will need a responsible adult at least 55 years of age to accompany you and drive you home. This person must remain in the waiting room during your procedure. The hospital will cancel your procedure if you do not have a responsible adult with you.   1. Wear loose fitting clothing that is easily removed. 2. Leave jewelry and other valuables at home.  3. Remove all body piercing jewelry and  leave at home. 4. Total time from sign-in until discharge is approximately 2-3 hours. 5. You should go home directly after your procedure and rest. You can resume normal activities the day after your procedure. 6. The day of your procedure you should not:  Drive  Make legal decisions  Operate machinery  Drink alcohol  Return to work   You may call the office (Dept: [phone]) before 5:00pm, or page the doctor on call [phone]) after 5:00pm, for further instructions, if necessary.   Insurance Information YOU WILL NEED TO CHECK WITH YOUR INSURANCE COMPANY FOR THE BENEFITS OF COVERAGE YOU HAVE FOR THIS PROCEDURE.  UNFORTUNATELY, NOT ALL INSURANCE COMPANIES HAVE BENEFITS TO COVER ALL OR PART OF THESE TYPES OF PROCEDURES.  IT IS YOUR RESPONSIBILITY TO CHECK YOUR BENEFITS, HOWEVER, WE WILL BE GLAD TO ASSIST YOU WITH ANY CODES YOUR INSURANCE COMPANY MAY NEED.    PLEASE NOTE THAT MOST INSURANCE COMPANIES WILL NOT COVER A SCREENING COLONOSCOPY FOR PEOPLE UNDER THE AGE OF 50  IF YOU HAVE BCBS INSURANCE, YOU MAY HAVE BENEFITS FOR A SCREENING COLONOSCOPY BUT IF POLYPS ARE FOUND THE DIAGNOSIS WILL CHANGE AND THEN YOU MAY HAVE A DEDUCTIBLE THAT WILL NEED TO BE MET. SO PLEASE MAKE SURE YOU CHECK YOUR BENEFITS FOR A SCREENING COLONOSCOPY AS WELL AS A DIAGNOSTIC COLONOSCOPY.

## 2018-07-06 NOTE — Progress Notes (Signed)
Gastroenterology Pre-Procedure Review  Request Date:07/06/18 Requesting Physician: Derrek Monaco NP [facility] ( last tcs- [facility]- 04/07/2010- tubular adenoma)  PATIENT REVIEW QUESTIONS: The patient responded to the following health history questions as indicated:    1. Diabetes Melitis: no 2. Joint replacements in the past 12 months: no 3. Major health problems in the past 3 months: no 4. Has an artificial valve or MVP: no 5. Has a defibrillator: no 6. Has been advised in past to take antibiotics in advance of a procedure like teeth cleaning: no 7. Family history of colon cancer: yes (mother age 30)  36. Alcohol Use: no 9. History of sleep apnea: no  10. History of coronary artery or other vascular stents placed within the last 12 months: no 11. History of any prior anesthesia complications: no    MEDICATIONS & ALLERGIES:    Patient reports the following regarding taking any blood thinners:   Plavix? no Aspirin? no Coumadin? no Brilinta? no Xarelto? no Eliquis? no Pradaxa? no Savaysa? no Effient? no  Patient confirms/reports the following medications:  Current Outpatient Medications  Medication Sig Dispense Refill  . atorvastatin (LIPITOR) 10 MG tablet Take 10 mg by mouth every morning.     Marland Kitchen lisinopril-hydrochlorothiazide (PRINZIDE,ZESTORETIC) 10-12.5 MG per tablet Take 1 tablet by mouth every morning.     Marland Kitchen acetaminophen (TYLENOL) 325 MG tablet Take 325 mg by mouth every 6 (six) hours as needed. For pain      No current facility-administered medications for this visit.    Facility-Administered Medications Ordered in Other Visits  Medication Dose Route Frequency Provider Last Rate Last Dose  . fentaNYL (SUBLIMAZE) injection 25-50 mcg  25-50 mcg Intravenous Q5 min PRN Lerry Liner, MD      . lactated ringers infusion   Intravenous Continuous Lerry Liner, MD        Patient confirms/reports the following allergies:  No Known Allergies  No orders of the defined types  were placed in this encounter.   AUTHORIZATION INFORMATION Primary Insurance: BCBS [place],  ID #: [id number] Pre-Cert / Auth required: no   SCHEDULE INFORMATION: Procedure has been scheduled as follows:  Date: 09/08/18, Time: 1:00  Location: [facility]   This Gastroenterology Pre-Precedure Review Form is being routed to the following provider(s): Neil Crouch, PA

## 2018-07-11 MED ORDER — PEG 3350-KCL-NA BICARB-NACL 420 G PO SOLR: 4000.0000 mL | ORAL | 0 refills | Status: DC

## 2018-07-11 NOTE — Addendum Note (Signed)
Addended by: Claudina Lick on: 07/11/2018 08:42 AM   Modules accepted: Orders

## 2018-07-11 NOTE — Progress Notes (Signed)
Pt called- suprep was $80.00 and she cannot afford it. rx for trilyte sent in and new instructions mailed to the pt.

## 2018-07-12 NOTE — Progress Notes (Signed)
Noted. Ok to schedule.

## 2018-09-08 ENCOUNTER — Other Ambulatory Visit: Payer: Self-pay

## 2018-09-08 ENCOUNTER — Ambulatory Visit (HOSPITAL_COMMUNITY)
Admission: RE | Admit: 2018-09-08 | Discharge: 2018-09-08 | Disposition: A | Discharge status: Home / Self Care | Payer: BLUE CROSS/BLUE SHIELD | Source: Ambulatory Visit | Attending: Gastroenterology | Admitting: Gastroenterology

## 2018-09-08 ENCOUNTER — Encounter (HOSPITAL_COMMUNITY): Payer: Self-pay | Admitting: *Deleted

## 2018-09-08 ENCOUNTER — Encounter (HOSPITAL_COMMUNITY)
Admission: RE | Disposition: A | Discharge status: Home / Self Care | Payer: Self-pay | Source: Ambulatory Visit | Attending: Gastroenterology

## 2018-09-08 DIAGNOSIS — R42 Dizziness and giddiness: Secondary | ICD-10-CM | POA: Insufficient documentation

## 2018-09-08 DIAGNOSIS — K644 Residual hemorrhoidal skin tags: Secondary | ICD-10-CM | POA: Diagnosis not present

## 2018-09-08 DIAGNOSIS — Z79899 Other long term (current) drug therapy: Secondary | ICD-10-CM | POA: Insufficient documentation

## 2018-09-08 DIAGNOSIS — Z9841 Cataract extraction status, right eye: Secondary | ICD-10-CM | POA: Diagnosis not present

## 2018-09-08 DIAGNOSIS — Z860100 Personal history of colon polyps, unspecified: Secondary | ICD-10-CM

## 2018-09-08 DIAGNOSIS — Q438 Other specified congenital malformations of intestine: Secondary | ICD-10-CM | POA: Insufficient documentation

## 2018-09-08 DIAGNOSIS — I10 Essential (primary) hypertension: Secondary | ICD-10-CM | POA: Diagnosis not present

## 2018-09-08 DIAGNOSIS — K648 Other hemorrhoids: Secondary | ICD-10-CM | POA: Diagnosis not present

## 2018-09-08 DIAGNOSIS — Z8249 Family history of ischemic heart disease and other diseases of the circulatory system: Secondary | ICD-10-CM | POA: Insufficient documentation

## 2018-09-08 DIAGNOSIS — Z841 Family history of disorders of kidney and ureter: Secondary | ICD-10-CM | POA: Insufficient documentation

## 2018-09-08 DIAGNOSIS — Z800 Family history of malignant neoplasm of digestive organs: Secondary | ICD-10-CM | POA: Diagnosis not present

## 2018-09-08 DIAGNOSIS — Z1211 Encounter for screening for malignant neoplasm of colon: Secondary | ICD-10-CM | POA: Insufficient documentation

## 2018-09-08 DIAGNOSIS — D122 Benign neoplasm of ascending colon: Secondary | ICD-10-CM | POA: Diagnosis not present

## 2018-09-08 DIAGNOSIS — Z8371 Family history of colonic polyps: Secondary | ICD-10-CM | POA: Insufficient documentation

## 2018-09-08 DIAGNOSIS — Z87891 Personal history of nicotine dependence: Secondary | ICD-10-CM | POA: Diagnosis not present

## 2018-09-08 DIAGNOSIS — Z9842 Cataract extraction status, left eye: Secondary | ICD-10-CM | POA: Diagnosis not present

## 2018-09-08 DIAGNOSIS — E7800 Pure hypercholesterolemia, unspecified: Secondary | ICD-10-CM | POA: Diagnosis not present

## 2018-09-08 DIAGNOSIS — D123 Benign neoplasm of transverse colon: Secondary | ICD-10-CM | POA: Insufficient documentation

## 2018-09-08 HISTORY — PX: POLYPECTOMY: SHX5525

## 2018-09-08 HISTORY — PX: COLONOSCOPY: SHX5424

## 2018-09-08 SURGERY — COLONOSCOPY
Anesthesia: Moderate Sedation

## 2018-09-08 MED ORDER — SODIUM CHLORIDE 0.9 % IV SOLN
INTRAVENOUS | Status: DC
  Administered 2018-09-08: 12:00:00 via INTRAVENOUS

## 2018-09-08 MED ORDER — LIDOCAINE HCL URETHRAL/MUCOSAL 2 % EX GEL
CUTANEOUS | Status: AC
  Filled 2018-09-08: qty 30

## 2018-09-08 MED ORDER — MIDAZOLAM HCL 5 MG/5ML IJ SOLN
INTRAMUSCULAR | Status: AC
  Filled 2018-09-08: qty 10

## 2018-09-08 MED ORDER — MEPERIDINE HCL 100 MG/ML IJ SOLN
INTRAMUSCULAR | Status: AC
  Filled 2018-09-08: qty 2

## 2018-09-08 MED ORDER — LIDOCAINE HCL URETHRAL/MUCOSAL 2 % EX GEL
CUTANEOUS | Status: DC | PRN
  Administered 2018-09-08: 1

## 2018-09-08 MED ORDER — MEPERIDINE HCL 100 MG/ML IJ SOLN
INTRAMUSCULAR | Status: DC | PRN
  Administered 2018-09-08: 25 mg via INTRAVENOUS
  Administered 2018-09-08: 50 mg via INTRAVENOUS

## 2018-09-08 MED ORDER — ONDANSETRON HCL 4 MG/2ML IJ SOLN
INTRAMUSCULAR | Status: AC
  Filled 2018-09-08: qty 2

## 2018-09-08 MED ORDER — STERILE WATER FOR IRRIGATION IR SOLN
Status: DC | PRN
  Administered 2018-09-08: 13:00:00

## 2018-09-08 MED ORDER — ONDANSETRON HCL 4 MG/2ML IJ SOLN
INTRAMUSCULAR | Status: DC | PRN
  Administered 2018-09-08: 4 mg via INTRAVENOUS

## 2018-09-08 MED ORDER — MIDAZOLAM HCL 5 MG/5ML IJ SOLN
INTRAMUSCULAR | Status: DC | PRN
  Administered 2018-09-08 (×3): 2 mg via INTRAVENOUS

## 2018-09-08 NOTE — Op Note (Signed)
[facility] Patient Name: Emma Pittman Procedure Date: 09/08/2018 1:02 PM MRN: [id number] Date of Birth: [birth year] Attending MD: Barney Drain MD, MD CSN: [id number] Age: 57 Admit Type: Outpatient Procedure:                Colonoscopy WITH COLD SNARE POLYPECTOMY Indications:              Personal history of colonic polyps Providers:                Barney Drain MD, MD, Otis Peak B. Sharon Seller, RN, Nelma Rothman, Technician Referring MD:             Jake Samples PA Medicines:                Ondansetron 4 mg IV, Meperidine 100 mg IV,                            Midazolam 6 mg IV Complications:            No immediate complications. Estimated Blood Loss:     Estimated blood loss was minimal. Procedure:                Pre-Anesthesia Assessment:                           - Prior to the procedure, a History and Physical                            was performed, and patient medications and                            allergies were reviewed. The patient's tolerance of                            previous anesthesia was also reviewed. The risks                            and benefits of the procedure and the sedation                            options and risks were discussed with the patient.                            All questions were answered, and informed consent                            was obtained. Prior Anticoagulants: The patient has                            taken no previous anticoagulant or antiplatelet                            agents. ASA Grade Assessment: II - A patient with  mild systemic disease. After reviewing the risks                            and benefits, the patient was deemed in                            satisfactory condition to undergo the procedure.                            After obtaining informed consent, the colonoscope                            was passed under direct vision. Throughout the                         procedure, the patient's blood pressure, pulse, and                            oxygen saturations were monitored continuously. The                            CF-[id number] ([id number]) scope was introduced through                            the anus and advanced to the the cecum, identified                            by appendiceal orifice and ileocecal valve. The                            colonoscopy was somewhat difficult due to a                            tortuous colon. Successful completion of the                            procedure was aided by straightening and shortening                            the scope to obtain bowel loop reduction. The                            patient tolerated the procedure well. The quality                            of the bowel preparation was good. The ileocecal                            valve, appendiceal orifice, and rectum were                            photographed. Scope In: 1:28:12 PM Scope Out: 1:44:05 PM Scope Withdrawal Time: 0 hours 14 minutes 10 seconds  Total Procedure Duration: 0 hours 15 minutes 53  seconds  Findings:      Two sessile polyps were found in the hepatic flexure and ascending       colon. The polyps were 4 to 5 mm in size. These polyps were removed with       a cold snare. Resection and retrieval were complete.      External and internal hemorrhoids were found. The hemorrhoids were       moderate.      The recto-sigmoid colon and sigmoid colon were mildly redundant. Impression:               - Two 4 to 5 mm polyps at the hepatic flexure and                            in the ascending colon, removed with a cold snare.                            Resected and retrieved.                           - External and internal hemorrhoids.                           - Redundant LEFT colon. Moderate Sedation:      Moderate (conscious) sedation was administered by the endoscopy nurse       and supervised by the  endoscopist. The following parameters were       monitored: oxygen saturation, heart rate, blood pressure, and response       to care. Total physician intraservice time was 29 minutes. Recommendation:           - Patient has a contact number available for                            emergencies. The signs and symptoms of potential                            delayed complications were discussed with the                            patient. Return to normal activities tomorrow.                            Written discharge instructions were provided to the                            patient.                           - High fiber diet.                           - Continue present medications.                           - Await pathology results.                           -  Repeat colonoscopy in 5-10 years for surveillance. Procedure Code(s):        --- Professional ---                           [phone], Colonoscopy, flexible; with removal of                            tumor(s), polyp(s), or other lesion(s) by snare                            technique                           G0500, Moderate sedation services provided by the                            same physician or other qualified health care                            professional performing a gastrointestinal                            endoscopic service that sedation supports,                            requiring the presence of an independent trained                            observer to assist in the monitoring of the                            patient's level of consciousness and physiological                            status; initial 15 minutes of intra-service time;                            patient age 46 years or older (additional time may                            be reported with [id number], as appropriate)                           [phone], Moderate sedation services provided by the                            same physician or other  qualified health care                            professional performing the diagnostic or                            therapeutic service that the sedation supports,  requiring the presence of an independent trained                            observer to assist in the monitoring of the                            patient's level of consciousness and physiological                            status; each additional 15 minutes intraservice                            time (List separately in addition to code for                            primary service) Diagnosis Code(s):        --- Professional ---                           D12.3, Benign neoplasm of transverse colon (hepatic                            flexure or splenic flexure)                           D12.2, Benign neoplasm of ascending colon                           K64.8, Other hemorrhoids                           Z86.010, Personal history of colonic polyps                           Q43.8, Other specified congenital malformations of                            intestine CPT copyright 2017 American Medical Association. All rights reserved. The codes documented in this report are preliminary and upon coder review may  be revised to meet current compliance requirements. Barney Drain, MD Barney Drain MD, MD 09/08/2018 2:00:16 PM This report has been signed electronically. Number of Addenda: 0

## 2018-09-08 NOTE — H&P (Signed)
Primary Care Physician:  Scherrie Bateman Primary Gastroenterologist:  Dr. Oneida Alar  Pre-Procedure History & Physical: HPI:  Emma Pittman is a 57 y.o. female here for FAMILY Hx COLON CA-mother HAD COLON CA AGE > 60/brother had polyps/personal history of polyps.  Past Medical History:  Diagnosis Date  . Hemorrhoids 03/22/2016  . Hypercholesteremia   . Hypertension   . PONV (postoperative nausea and vomiting)   . Spotting 03/06/2014  . Vertigo   . Vitiligo     Past Surgical History:  Procedure Laterality Date  . APPENDECTOMY  age 12  . CATARACT EXTRACTION W/PHACO  08/19/2011   Procedure: CATARACT EXTRACTION PHACO AND INTRAOCULAR LENS PLACEMENT (IOC);  Surgeon: Tonny Branch;  Location: [facility];  Service: Ophthalmology;  Laterality: Right;  CDE:9.50  . CATARACT EXTRACTION W/PHACO  09/06/2011   Procedure: CATARACT EXTRACTION PHACO AND INTRAOCULAR LENS PLACEMENT (IOC);  Surgeon: Tonny Branch;  Location: [facility];  Service: Ophthalmology;  Laterality: Left;  CDE: 5.25  . COLONOSCOPY    . ENDOMETRIAL ABLATION  8 yrs ago-eure   aph  . TUBAL LIGATION  17 yrs & 12 yrs    aph-Dr Ferguson:also had dye studies of tubes with opening of one tube    Prior to Admission medications   Medication Sig Start Date End Date Taking? Authorizing Provider  acetaminophen (TYLENOL) 500 MG tablet Take 1,000 mg by mouth every 6 (six) hours as needed for moderate pain or headache.    Yes [provider]  atorvastatin (LIPITOR) 10 MG tablet Take 10 mg by mouth every morning.    Yes [provider]  lisinopril-hydrochlorothiazide (PRINZIDE,ZESTORETIC) 10-12.5 MG per tablet Take 1 tablet by mouth every morning.    Yes [provider]  Na Sulfate-K Sulfate-Mg Sulf (SUPREP BOWEL PREP KIT) 17.5-3.13-1.6 GM/177ML SOLN Take 1 kit by mouth as directed. Patient not taking: Reported on 09/05/2018 07/06/18   Mahala Menghini, PA-C  polyethylene glycol-electrolytes (TRILYTE) 420 g solution Take  4,000 mLs by mouth as directed. Patient not taking: Reported on 09/05/2018 07/11/18   Mahala Menghini, PA-C    Allergies as of 07/06/2018  . (No Known Allergies)    Family History  Problem Relation Age of Onset  . Heart attack Mother   . Cancer Mother        colon  . Kidney failure Father   . Congestive Heart Failure Father   . Congestive Heart Failure Brother        colon polyps  . Anesthesia problems Neg Hx   . Hypotension Neg Hx   . Malignant hyperthermia Neg Hx   . Pseudochol deficiency Neg Hx     Social History   Socioeconomic History  . Marital status: Married    Spouse name: Not on file  . Number of children: Not on file  . Years of education: Not on file  . Highest education level: Not on file  Occupational History  . Not on file  Social Needs  . Financial resource strain: Not on file  . Food insecurity:    Worry: Not on file    Inability: Not on file  . Transportation needs:    Medical: Not on file    Non-medical: Not on file  Tobacco Use  . Smoking status: Former Smoker    Packs/day: 0.50    Years: 4.00    Pack years: 2.00    Types: Cigarettes    Last attempt to quit: 08/12/1991    Years since quitting: 27.0  .  Smokeless tobacco: Never Used  Substance and Sexual Activity  . Alcohol use: Yes    Comment: occassional  . Drug use: No  . Sexual activity: Yes    Birth control/protection: Surgical    Comment: tubal and ablation  Lifestyle  . Physical activity:    Days per week: Not on file    Minutes per session: Not on file  . Stress: Not on file  Relationships  . Social connections:    Talks on phone: Not on file    Gets together: Not on file    Attends religious service: Not on file    Active member of club or organization: Not on file    Attends meetings of clubs or organizations: Not on file    Relationship status: Not on file  . Intimate partner violence:    Fear of current or ex partner: Not on file    Emotionally abused: Not on file     Physically abused: Not on file    Forced sexual activity: Not on file  Other Topics Concern  . Not on file  Social History Narrative  . Not on file    Review of Systems: See HPI, otherwise negative ROS   Physical Exam: BP (!) 147/75   Pulse 87   Temp 98.5 F (36.9 C) (Oral)   Resp 13   Ht _0  (1.575 m)   Wt 89.8 kg   SpO2 98%   BMI 36.21 kg/m  General:   Alert,  pleasant and cooperative in NAD Head:  Normocephalic and atraumatic. Neck:  Supple; Lungs:  Clear throughout to auscultation.    Heart:  Regular rate and rhythm. Abdomen:  Soft, nontender and nondistended. Normal bowel sounds, without guarding, and without rebound.   Neurologic:  Alert and  oriented x4;  grossly normal neurologically.  Impression/Plan:     FAMILY Hx COLON CA-mother HAD COLON CA AGE > 60/brother had polyps/personal history of polyps.  PLAN: 1. TCS TODAY. DISCUSSED PROCEDURE, BENEFITS, & RISKS: < 1% chance of medication reaction, bleeding, perforation, or rupture of spleen/liver.

## 2018-09-08 NOTE — Discharge Instructions (Signed)
You had 2 polyps removed. You have MODERATE internal hemorrhoids.   DRINK WATER TO KEEP YOUR URINE LIGHT YELLOW.  CONTINUE YOUR WEIGHT LOSS EFFORTS.  WHILE I DO NOT WANT TO ALARM YOU, YOUR BODY MASS INDEX IS OVER 30 WHICH MEANS YOU ARE OBESE. OBESITY CAN ACTIVATE CANCER GENES. OBESITY IS ASSOCIATED WITH AN INCREASED RISK FOR CIRRHOSIS AND ALL CANCERS, INCLUDING ESOPHAGEAL AND COLON CANCER. A WEIGHT OF 160 LBS  WILL GET YOUR BODY MASS INDEX(BMI) UNDER 30.  FOLLOW A HIGH FIBER DIET. AVOID ITEMS THAT CAUSE BLOATING & GAS. SEE INFO BELOW.  YOUR BIOPSY RESULTS WILL BE BACK IN 5 BUSINESS DAYS.  Next colonoscopy in 5 years.    Colonoscopy Care After Read the instructions outlined below and refer to this sheet in the next week. These discharge instructions provide you with general information on caring for yourself after you leave the hospital. While your treatment has been planned according to the most current medical practices available, unavoidable complications occasionally occur. If you have any problems or questions after discharge, call DR. Nyeisha Goodall, [phone].  ACTIVITY  You may resume your regular activity, but move at a slower pace for the next 24 hours.   Take frequent rest periods for the next 24 hours.   Walking will help get rid of the air and reduce the bloated feeling in your belly (abdomen).   No driving for 24 hours (because of the medicine (anesthesia) used during the test).   You may shower.   Do not sign any important legal documents or operate any machinery for 24 hours (because of the anesthesia used during the test).    NUTRITION  Drink plenty of fluids.   You may resume your normal diet as instructed by your doctor.   Begin with a light meal and progress to your normal diet. Heavy or fried foods are harder to digest and may make you feel sick to your stomach (nauseated).   Avoid alcoholic beverages for 24 hours or as instructed.    MEDICATIONS  You may  resume your normal medications.   WHAT YOU CAN EXPECT TODAY  Some feelings of bloating in the abdomen.   Passage of more gas than usual.   Spotting of blood in your stool or on the toilet paper  .  IF YOU HAD POLYPS REMOVED DURING THE COLONOSCOPY:  Eat a soft diet IF YOU HAVE NAUSEA, BLOATING, ABDOMINAL PAIN, OR VOMITING.    FINDING OUT THE RESULTS OF YOUR TEST Not all test results are available during your visit. DR. Oneida Alar WILL CALL YOU WITHIN 14 DAYS OF YOUR PROCEDUE WITH YOUR RESULTS. Do not assume everything is normal if you have not heard from DR. Lorijean Husser, CALL HER OFFICE AT [phone].  SEEK IMMEDIATE MEDICAL ATTENTION AND CALL THE OFFICE: [phone] IF:  You have more than a spotting of blood in your stool.   Your belly is swollen (abdominal distention).   You are nauseated or vomiting.   You have a temperature over 101F.   You have abdominal pain or discomfort that is severe or gets worse throughout the day.   High-Fiber Diet A high-fiber diet changes your normal diet to include more whole grains, legumes, fruits, and vegetables. Changes in the diet involve replacing refined carbohydrates with unrefined foods. The calorie level of the diet is essentially unchanged. The Dietary Reference Intake (recommended amount) for adult males is 38 grams per day. For adult females, it is 25 grams per day. Pregnant and lactating women should consume  28 grams of fiber per day. Fiber is the intact part of a plant that is not broken down during digestion. Functional fiber is fiber that has been isolated from the plant to provide a beneficial effect in the body.  PURPOSE  Increase stool bulk.   Ease and regulate bowel movements.   Lower cholesterol.   REDUCE RISK OF COLON CANCER  INDICATIONS THAT YOU NEED MORE FIBER  Constipation and hemorrhoids.   Uncomplicated diverticulosis (intestine condition) and irritable bowel syndrome.   Weight management.   As a protective  measure against hardening of the arteries (atherosclerosis), diabetes, and cancer.   GUIDELINES FOR INCREASING FIBER IN THE DIET  Start adding fiber to the diet slowly. A gradual increase of about 5 more grams (2 slices of whole-wheat bread, 2 servings of most fruits or vegetables, or 1 bowl of high-fiber cereal) per day is best. Too rapid an increase in fiber may result in constipation, flatulence, and bloating.   Drink enough water and fluids to keep your urine clear or pale yellow. Water, juice, or caffeine-free drinks are recommended. Not drinking enough fluid may cause constipation.   Eat a variety of high-fiber foods rather than one type of fiber.   Try to increase your intake of fiber through using high-fiber foods rather than fiber pills or supplements that contain small amounts of fiber.   The goal is to change the types of food eaten. Do not supplement your present diet with high-fiber foods, but replace foods in your present diet.   INCLUDE A VARIETY OF FIBER SOURCES  Replace refined and processed grains with whole grains, canned fruits with fresh fruits, and incorporate other fiber sources. White rice, white breads, and most bakery goods contain little or no fiber.   Brown whole-grain rice, buckwheat oats, and many fruits and vegetables are all good sources of fiber. These include: broccoli, Brussels sprouts, cabbage, cauliflower, beets, sweet potatoes, white potatoes (skin on), carrots, tomatoes, eggplant, squash, berries, fresh fruits, and dried fruits.   Cereals appear to be the richest source of fiber. Cereal fiber is found in whole grains and bran. Bran is the fiber-rich outer coat of cereal grain, which is largely removed in refining. In whole-grain cereals, the bran remains. In breakfast cereals, the largest amount of fiber is found in those with "bran" in their names. The fiber content is sometimes indicated on the label.   You may need to include additional fruits and  vegetables each day.   In baking, for 1 cup white flour, you may use the following substitutions:   1 cup whole-wheat flour minus 2 tablespoons.   1/2 cup white flour plus 1/2 cup whole-wheat flour.   Polyps, Colon  A polyp is extra tissue that grows inside your body. Colon polyps grow in the large intestine. The large intestine, also called the colon, is part of your digestive system. It is a long, hollow tube at the end of your digestive tract where your body makes and stores stool. Most polyps are not dangerous. They are benign. This means they are not cancerous. But over time, some types of polyps can turn into cancer. Polyps that are smaller than a pea are usually not harmful. But larger polyps could someday become or may already be cancerous. To be safe, doctors remove all polyps and test them.   WHO GETS POLYPS? Anyone can get polyps, but certain people are more likely than others. You may have a greater chance of getting polyps if:  You  are over 63.   You have had polyps before.   Someone in your family has had polyps.   Someone in your family has had cancer of the large intestine.   Find out if someone in your family has had polyps. You may also be more likely to get polyps if you:   Eat a lot of fatty foods   Smoke   Drink alcohol   Do not exercise  Eat too much   PREVENTION There is not one sure way to prevent polyps. You might be able to lower your risk of getting them if you:  Eat more fruits and vegetables and less fatty food.   Do not smoke.   Avoid alcohol.   Exercise every day.   Lose weight if you are overweight.   Eating more calcium and folate can also lower your risk of getting polyps. Some foods that are rich in calcium are milk, cheese, and broccoli. Some foods that are rich in folate are chickpeas, kidney beans, and spinach.

## 2018-09-13 ENCOUNTER — Encounter (HOSPITAL_COMMUNITY): Payer: Self-pay | Admitting: Gastroenterology

## 2018-09-14 NOTE — Progress Notes (Signed)
PT is aware.

## 2018-09-18 NOTE — Progress Notes (Signed)
CC'D TO PCP AND ON RECALL  °

## 2019-05-02 ENCOUNTER — Emergency Department (HOSPITAL_COMMUNITY)
Admission: EM | Admit: 2019-05-02 | Discharge: 2019-05-02 | Disposition: A | Discharge status: Home / Self Care | Payer: BLUE CROSS/BLUE SHIELD | Attending: Emergency Medicine | Admitting: Emergency Medicine

## 2019-05-02 ENCOUNTER — Other Ambulatory Visit (HOSPITAL_COMMUNITY): Payer: BLUE CROSS/BLUE SHIELD

## 2019-05-02 ENCOUNTER — Emergency Department (HOSPITAL_COMMUNITY): Payer: BLUE CROSS/BLUE SHIELD

## 2019-05-02 ENCOUNTER — Other Ambulatory Visit: Payer: Self-pay

## 2019-05-02 ENCOUNTER — Encounter (HOSPITAL_COMMUNITY): Payer: Self-pay

## 2019-05-02 DIAGNOSIS — R079 Chest pain, unspecified: Secondary | ICD-10-CM | POA: Insufficient documentation

## 2019-05-02 DIAGNOSIS — M7918 Myalgia, other site: Secondary | ICD-10-CM

## 2019-05-02 DIAGNOSIS — Y93I9 Activity, other involving external motion: Secondary | ICD-10-CM | POA: Diagnosis not present

## 2019-05-02 DIAGNOSIS — Y92410 Unspecified street and highway as the place of occurrence of the external cause: Secondary | ICD-10-CM | POA: Insufficient documentation

## 2019-05-02 DIAGNOSIS — R51 Headache: Secondary | ICD-10-CM | POA: Insufficient documentation

## 2019-05-02 DIAGNOSIS — I10 Essential (primary) hypertension: Secondary | ICD-10-CM | POA: Insufficient documentation

## 2019-05-02 DIAGNOSIS — S161XXA Strain of muscle, fascia and tendon at neck level, initial encounter: Secondary | ICD-10-CM | POA: Diagnosis not present

## 2019-05-02 DIAGNOSIS — Y998 Other external cause status: Secondary | ICD-10-CM | POA: Diagnosis not present

## 2019-05-02 DIAGNOSIS — Z79899 Other long term (current) drug therapy: Secondary | ICD-10-CM | POA: Diagnosis not present

## 2019-05-02 DIAGNOSIS — S4992XA Unspecified injury of left shoulder and upper arm, initial encounter: Secondary | ICD-10-CM | POA: Diagnosis present

## 2019-05-02 DIAGNOSIS — R109 Unspecified abdominal pain: Secondary | ICD-10-CM | POA: Insufficient documentation

## 2019-05-02 LAB — BASIC METABOLIC PANEL
Anion gap: 12 (ref 5–15)
BUN: 17 mg/dL (ref 6–20)
CO2: 22 mmol/L (ref 22–32)
Calcium: 9.3 mg/dL (ref 8.9–10.3)
Chloride: 106 mmol/L (ref 98–111)
Creatinine, Ser: 0.63 mg/dL (ref 0.44–1.00)
GFR calc Af Amer: >60 mL/min (ref >60)
GFR calc non Af Amer: >60 mL/min (ref >60)
Glucose, Bld: 105 mg/dL — ABNORMAL HIGH (ref 70–99)
Potassium: 3.6 mmol/L (ref 3.5–5.1)
Sodium: 140 mmol/L (ref 135–145)

## 2019-05-02 MED ORDER — IOHEXOL 300 MG/ML  SOLN
100.0000 mL | Freq: Once | INTRAMUSCULAR | Status: AC | PRN
  Administered 2019-05-02: 100 mL via INTRAVENOUS

## 2019-05-02 MED ORDER — CYCLOBENZAPRINE HCL 5 MG PO TABS: 5.0000 mg | ORAL_TABLET | Freq: Three times a day (TID) | ORAL | 0 refills | Status: DC | PRN

## 2019-05-02 MED ORDER — NAPROXEN 500 MG PO TABS: 500.0000 mg | ORAL_TABLET | Freq: Two times a day (BID) | ORAL | 0 refills | Status: DC
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
## 2019-05-02 NOTE — ED Notes (Signed)
Lab made aware of pending blood draw
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
## 2019-05-02 NOTE — ED Notes (Signed)
Tiffany (daughter) phoned by this nurse and updated
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
## 2019-05-02 NOTE — ED Notes (Signed)
Patient ambulatory to restroom with assistance.  Urine specimen was collected.
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
## 2019-05-02 NOTE — ED Notes (Signed)
Patient transported to CT 
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
## 2019-06-06 ENCOUNTER — Other Ambulatory Visit (HOSPITAL_COMMUNITY): Payer: Self-pay | Admitting: Adult Health

## 2019-06-06 DIAGNOSIS — Z1231 Encounter for screening mammogram for malignant neoplasm of breast: Secondary | ICD-10-CM

## 2020-09-22 ENCOUNTER — Other Ambulatory Visit (HOSPITAL_COMMUNITY): Payer: Self-pay | Admitting: Family Medicine

## 2020-09-22 DIAGNOSIS — Z1231 Encounter for screening mammogram for malignant neoplasm of breast: Secondary | ICD-10-CM

## 2020-09-29 ENCOUNTER — Ambulatory Visit (HOSPITAL_COMMUNITY): Payer: BLUE CROSS/BLUE SHIELD

## 2020-10-15 ENCOUNTER — Other Ambulatory Visit: Payer: Self-pay

## 2020-10-15 ENCOUNTER — Ambulatory Visit (HOSPITAL_COMMUNITY)
Admission: RE | Admit: 2020-10-15 | Discharge: 2020-10-15 | Disposition: A | Discharge status: Home / Self Care | Payer: 59 | Source: Ambulatory Visit | Attending: Family Medicine | Admitting: Family Medicine

## 2020-10-15 DIAGNOSIS — Z1231 Encounter for screening mammogram for malignant neoplasm of breast: Secondary | ICD-10-CM | POA: Insufficient documentation

## 2020-10-21 ENCOUNTER — Other Ambulatory Visit: Payer: BLUE CROSS/BLUE SHIELD | Admitting: Adult Health

## 2020-11-11 ENCOUNTER — Other Ambulatory Visit (HOSPITAL_COMMUNITY)
Admission: RE | Admit: 2020-11-11 | Discharge: 2020-11-11 | Disposition: A | Discharge status: Home / Self Care | Payer: 59 | Source: Ambulatory Visit | Attending: Adult Health | Admitting: Adult Health

## 2020-11-11 ENCOUNTER — Other Ambulatory Visit: Payer: Self-pay

## 2020-11-11 ENCOUNTER — Encounter: Payer: Self-pay | Admitting: Adult Health

## 2020-11-11 ENCOUNTER — Ambulatory Visit: Payer: 59 | Admitting: Adult Health

## 2020-11-11 VITALS — BP 139/63 | HR 68 | Ht 62.5 in | Wt 211.0 lb

## 2020-11-11 DIAGNOSIS — Z01419 Encounter for gynecological examination (general) (routine) without abnormal findings: Secondary | ICD-10-CM | POA: Diagnosis not present

## 2020-11-11 DIAGNOSIS — F32A Depression, unspecified: Secondary | ICD-10-CM | POA: Diagnosis not present

## 2020-11-11 DIAGNOSIS — Z1211 Encounter for screening for malignant neoplasm of colon: Secondary | ICD-10-CM | POA: Insufficient documentation

## 2020-11-11 DIAGNOSIS — F4321 Adjustment disorder with depressed mood: Secondary | ICD-10-CM | POA: Insufficient documentation

## 2020-11-11 LAB — HEMOCCULT GUIAC POC 1CARD (OFFICE): Fecal Occult Blood, POC: NEGATIVE

## 2020-11-11 NOTE — Progress Notes (Signed)
Patient ID: Emma Pittman, female   DOB: [birth year], 59 y.o.   MRN: [id number] History of Present Illness: Emma Pittman is a 59 year old white female, recently widowed, PM in for a well woman gyn exam and pap. Her husband Billy died 2020/09/28. She is depressed and not sleeping well, may eat a lot or hardly nothing, but doing the best she can, she says. PCP is Delman Cheadle PA.   Current Medications, Allergies, Past Medical History, Past Surgical History, Family History and Social History were reviewed in [employer] record.     Review of Systems: Patient denies any headaches, hearing loss, fatigue, blurred vision, shortness of breath, chest pain, abdominal pain, problems with bowel movements, urination, or intercourse(not active). No joint pain or mood swings. See HPI for positives.  Physical Exam:BP 139/63 (BP Location: Left Arm, Patient Position: Sitting, Cuff Size: Normal)   Pulse 68   Ht 5' 2.5" (1.588 m)   Wt 211 lb (95.7 kg)   BMI 37.98 kg/m  General:  Well developed, well nourished, no acute distress.she is teary today Skin:  Warm and dry Neck:  Midline trachea, normal thyroid, good ROM, no lymphadenopathy Lungs; Clear to auscultation bilaterally Breast:  No dominant palpable mass, retraction, or nipple discharge Cardiovascular: Regular rate and rhythm Abdomen:  Soft, non tender, no hepatosplenomegaly Pelvic:  External genitalia has some vitiligo and pink thickened skin,like lichen sclerosus. The vagina is normal in appearance. Urethra has no lesions or masses. The cervix is smooth, pap with high risk HPV genotyping performed.  Uterus is felt to be normal size, shape, and contour.  No adnexal masses or tenderness noted.Bladder is non tender, no masses felt. Rectal: Good sphincter tone, no polyps, + hemorrhoids felt.  Hemoccult negative. Extremities/musculoskeletal:  No swelling or varicosities noted, no clubbing or cyanosis Psych:  No mood changes, alert and  cooperative,seems happy AA is 4 Fall risk is low PHQ 9 score is 20, denies any SI, and declines meds or counseling   Upstream - 11/11/20 1340      Pregnancy Intention Screening   Does the patient want to become pregnant in the next year? N/A    Does the patient's partner want to become pregnant in the next year? N/A    Would the patient like to discuss contraceptive options today? N/A      Contraception Wrap Up   Current Method Female Sterilization    End Method Female Sterilization    Contraception Counseling Provided No         Examination chaperoned by Levy Pupa LPN  Impression and Plan:  1. Encounter for gynecological examination with Papanicolaou smear of cervix Pap sent Physical in 1 year Pap in 3 if normal Mammogram yearly Colonoscopy 2024 Labs with PCP  2. Encounter for screening fecal occult blood testing  3. Depression, unspecified depression type Discussed meds and she declines at present  4. Grief reaction Call me if needs to talk  Discussed people grieve differently  Made her aware that there is grief counseling at funeral home

## 2020-11-17 LAB — CYTOLOGY - PAP
Adequacy: ABSENT
Comment: NEGATIVE
Diagnosis: NEGATIVE
High risk HPV: NEGATIVE

## 2020-12-08 ENCOUNTER — Telehealth: Payer: Self-pay | Admitting: Adult Health

## 2020-12-08 NOTE — Telephone Encounter (Signed)
Pt calling to request Rx for her nerves, states she discussed this with Victorino Dike at her last visit  Please advise & notify pt   [facility]

## 2020-12-08 NOTE — Telephone Encounter (Signed)
Pt was called to inform her that Cyril Mourning, NP was not here today, but that her request would be forwarded to her for tomorrow.

## 2020-12-09 MED ORDER — LORAZEPAM 0.5 MG PO TABS: 0.5000 mg | ORAL_TABLET | Freq: Three times a day (TID) | ORAL | 0 refills | Status: DC | PRN

## 2020-12-09 MED ORDER — ESCITALOPRAM OXALATE 10 MG PO TABS
10.0000 mg | ORAL_TABLET | Freq: Every day | ORAL | 2 refills | Status: DC
Start: 2020-12-09 — End: 2021-03-02

## 2020-12-09 NOTE — Telephone Encounter (Signed)
Will rx lexapro 10 mg 1 daily and ativan 0.5 mg #15 1 every 8 hours prn anxiety

## 2020-12-09 NOTE — Addendum Note (Signed)
Addended by: Cyril Mourning A on: 12/09/2020 01:33 PM   Modules accepted: Orders

## 2021-02-28 ENCOUNTER — Other Ambulatory Visit: Payer: Self-pay | Admitting: Adult Health

## 2021-05-15 DIAGNOSIS — Z029 Encounter for administrative examinations, unspecified: Secondary | ICD-10-CM

## 2021-09-07 ENCOUNTER — Other Ambulatory Visit (HOSPITAL_COMMUNITY): Payer: Self-pay | Admitting: Family Medicine

## 2021-09-07 ENCOUNTER — Other Ambulatory Visit (HOSPITAL_COMMUNITY): Payer: Self-pay | Admitting: Physician Assistant

## 2021-09-07 DIAGNOSIS — Z1231 Encounter for screening mammogram for malignant neoplasm of breast: Secondary | ICD-10-CM

## 2021-10-22 ENCOUNTER — Ambulatory Visit (HOSPITAL_COMMUNITY)
Admission: RE | Admit: 2021-10-22 | Discharge: 2021-10-22 | Disposition: A | Discharge status: Home / Self Care | Payer: Medicaid Other | Source: Ambulatory Visit | Attending: Physician Assistant | Admitting: Physician Assistant

## 2021-10-22 ENCOUNTER — Other Ambulatory Visit: Payer: Self-pay

## 2021-10-22 DIAGNOSIS — Z1231 Encounter for screening mammogram for malignant neoplasm of breast: Secondary | ICD-10-CM | POA: Insufficient documentation

## 2021-11-12 ENCOUNTER — Encounter: Payer: Self-pay | Admitting: Adult Health

## 2021-11-12 ENCOUNTER — Ambulatory Visit (INDEPENDENT_AMBULATORY_CARE_PROVIDER_SITE_OTHER): Payer: Medicaid Other | Admitting: Adult Health

## 2021-11-12 ENCOUNTER — Other Ambulatory Visit: Payer: Self-pay

## 2021-11-12 VITALS — BP 125/81 | HR 69 | Ht 62.25 in | Wt 213.5 lb

## 2021-11-12 DIAGNOSIS — Z01419 Encounter for gynecological examination (general) (routine) without abnormal findings: Secondary | ICD-10-CM

## 2021-11-12 DIAGNOSIS — Z1211 Encounter for screening for malignant neoplasm of colon: Secondary | ICD-10-CM | POA: Diagnosis not present

## 2021-11-12 LAB — HEMOCCULT GUIAC POC 1CARD (OFFICE): Fecal Occult Blood, POC: NEGATIVE

## 2021-11-12 NOTE — Progress Notes (Signed)
Patient ID: Emma Pittman, female   DOB: 1961/06/07, 60 y.o.   MRN: [id number] History of Present Illness: Emma Pittman is a 60 year old white female, widowed, PM in for a well woman gyn exam. Lab Results  Component Value Date   DIAGPAP  11/11/2020    - Negative for intraepithelial lesion or malignancy (NILM)   HPV NOT DETECTED 04/26/2017   [place] Negative 11/11/2020    PCP is Emma Cheadle PA.  Current Medications, Allergies, Past Medical History, Past Surgical History, Family History and Social History were reviewed in [employer] record.     Review of Systems: Patient denies any headaches, hearing loss, fatigue, blurred vision, shortness of breath, chest pain, abdominal pain, problems with bowel movements, urination, or intercourse. (Not having sex).No joint pain or mood swings.     Physical Exam:BP 125/81 (BP Location: Left Arm, Patient Position: Sitting, Cuff Size: Large)   Pulse 69   Ht 5' 2.25" (1.581 m)   Wt 213 lb 8 oz (96.8 kg)   BMI 38.74 kg/m   General:  Well developed, well nourished, no acute distress Skin:  Warm and dry Neck:  Midline trachea, normal thyroid, good ROM, no lymphadenopathy Lungs; Clear to auscultation bilaterally Breast:  No dominant palpable mass, retraction, or nipple discharge,has vitiligo under breasts  Cardiovascular: Regular rate and rhythm Abdomen:  Soft, non tender, no hepatosplenomegaly Pelvic:  External genitalia is normal in appearance, has vitiligo in groin.  The vagina is normal in appearance. Urethra has no lesions or masses. The cervix is smooth.  Uterus is felt to be normal size, shape, and contour.  No adnexal masses or tenderness noted.Bladder is non tender, no masses felt. Rectal: Good sphincter tone, no polyps, or hemorrhoids felt.  Hemoccult negative. Extremities/musculoskeletal:  No swelling or varicosities noted, no clubbing or cyanosis Psych:  No mood changes, alert and cooperative,seems happy AA is 0   Fall risk is low Depression screen [facility] 2/9 11/12/2021 11/11/2020 06/09/2018  Decreased Interest 2 3 -  Down, Depressed, Hopeless 1 3 0  PHQ - 2 Score 3 6 0  Altered sleeping 3 3 1   Tired, decreased energy 1 2 1   Change in appetite 1 3 0  Feeling bad or failure about yourself  0 2 0  Trouble concentrating 1 2 0  Moving slowly or fidgety/restless 0 2 0  Suicidal thoughts 0 0 0  PHQ-9 Score 9 20 2   Difficult doing work/chores - - Not difficult at all   She is on lexapro GAD 7 : Generalized Anxiety Score 11/12/2021 11/11/2020  Nervous, Anxious, on Edge 1 2  Control/stop worrying 1 2  Worry too much - different things 1 2  Trouble relaxing 1 2  Restless 1 2  Easily annoyed or irritable 1 2  Afraid - awful might happen 1 0  Total GAD 7 Score 7 12      [facility] - 11/12/21 6503       Pregnancy Intention Screening   Does the patient want to become pregnant in the next year? No    Does the patient's partner want to become pregnant in the next year? No    Would the patient like to discuss contraceptive options today? No      Contraception Wrap Up   Current Method Female Sterilization    End Method Female Sterilization    Contraception Counseling Provided No             Examination chaperoned by Levy Pupa LPN  Impression and Plan: 1. Encounter for well woman exam with routine gynecological exam Physical in 1 year Pap in 2024 Wil lcheck fasting labs  - CBC - Comprehensive metabolic panel - TSH - Lipid panel Mammogram yearly Colonoscopy per GI  2. Encounter for screening fecal occult blood testing

## 2021-11-13 LAB — COMPREHENSIVE METABOLIC PANEL
ALT: 14 IU/L (ref 0–32)
AST: 19 IU/L (ref 0–40)
Albumin/Globulin Ratio: 2 (ref 1.2–2.2)
Albumin: 4.3 g/dL (ref 3.8–4.9)
Alkaline Phosphatase: 78 IU/L (ref 44–121)
BUN/Creatinine Ratio: 20 (ref 9–23)
BUN: 14 mg/dL (ref 6–24)
Bilirubin Total: 0.6 mg/dL (ref 0.0–1.2)
CO2: 26 mmol/L (ref 20–29)
Calcium: 9.4 mg/dL (ref 8.7–10.2)
Chloride: 103 mmol/L (ref 96–106)
Creatinine, Ser: 0.7 mg/dL (ref 0.57–1.00)
Globulin, Total: 2.2 g/dL (ref 1.5–4.5)
Glucose: 88 mg/dL (ref 70–99)
Potassium: 3.9 mmol/L (ref 3.5–5.2)
Sodium: 143 mmol/L (ref 134–144)
Total Protein: 6.5 g/dL (ref 6.0–8.5)
eGFR: 100 mL/min/{1.73_m2} (ref >59)

## 2021-11-13 LAB — LIPID PANEL
Chol/HDL Ratio: 2.9 ratio (ref 0.0–4.4)
Cholesterol, Total: 204 mg/dL — ABNORMAL HIGH (ref 100–199)
HDL: 71 mg/dL (ref >39)
LDL Chol Calc (NIH): 122 mg/dL — ABNORMAL HIGH (ref 0–99)
Triglycerides: 63 mg/dL (ref 0–149)
VLDL Cholesterol Cal: 11 mg/dL (ref 5–40)

## 2021-11-13 LAB — CBC
Hematocrit: 42.5 % (ref 34.0–46.6)
Hemoglobin: 13.6 g/dL (ref 11.1–15.9)
MCH: 27.5 pg (ref 26.6–33.0)
MCHC: 32 g/dL (ref 31.5–35.7)
MCV: 86 fL (ref 79–97)
Platelets: 281 10*3/uL (ref 150–450)
RBC: 4.94 x10E6/uL (ref 3.77–5.28)
RDW: 12.9 % (ref 11.7–15.4)
WBC: 5.4 10*3/uL (ref 3.4–10.8)

## 2021-11-13 LAB — TSH: TSH: 2.92 u[IU]/mL (ref 0.450–4.500)

## 2021-11-14 ENCOUNTER — Other Ambulatory Visit: Payer: Self-pay | Admitting: Adult Health

## 2021-12-29 ENCOUNTER — Other Ambulatory Visit (HOSPITAL_COMMUNITY): Payer: Self-pay | Admitting: Physician Assistant

## 2021-12-29 ENCOUNTER — Other Ambulatory Visit: Payer: Self-pay

## 2021-12-29 ENCOUNTER — Ambulatory Visit (HOSPITAL_COMMUNITY)
Admission: RE | Admit: 2021-12-29 | Discharge: 2021-12-29 | Disposition: A | Discharge status: Home / Self Care | Payer: Medicaid Other | Source: Ambulatory Visit | Attending: Physician Assistant | Admitting: Physician Assistant

## 2021-12-29 DIAGNOSIS — M25572 Pain in left ankle and joints of left foot: Secondary | ICD-10-CM | POA: Insufficient documentation

## 2022-04-15 ENCOUNTER — Ambulatory Visit: Payer: Medicaid Other | Admitting: Orthopedic Surgery

## 2022-04-15 ENCOUNTER — Encounter: Payer: Self-pay | Admitting: Orthopedic Surgery

## 2022-04-15 VITALS — Wt 210.0 lb

## 2022-04-15 DIAGNOSIS — M7662 Achilles tendinitis, left leg: Secondary | ICD-10-CM | POA: Diagnosis not present

## 2022-04-15 MED ORDER — PREDNISONE 10 MG PO TABS: 10.0000 mg | ORAL_TABLET | Freq: Three times a day (TID) | ORAL | 0 refills | Status: AC

## 2022-04-15 MED ORDER — MELOXICAM 7.5 MG PO TABS: 7.5000 mg | ORAL_TABLET | Freq: Every day | ORAL | 1 refills | Status: DC

## 2022-04-15 NOTE — Progress Notes (Signed)
Chief complaint left heel pain ? ?HPI: 61 year old female started having heel pain sometime in January no trauma.  Pain is on the posterior aspect of the heel.  She went to primary care was placed on ibuprofen and told to use a topical cream which did not help she came back for follow-up visit they took an x-ray she has a spur on the back of the heel she presents now with pain and swelling ? ?Past Medical History:  ?Diagnosis Date  ? Hemorrhoids 03/22/2016  ? Hypercholesteremia   ? Hypertension   ? PONV (postoperative nausea and vomiting)   ? Spotting 03/06/2014  ? Vertigo   ? Vitiligo   ? ?Review of systems.  I interviewed the patient for review of systems.  She says that she complains of no fever no hearing loss no wheezing noted heart palpitations or chest pain she has corrected vision but no light sensitivity.  She does say she has some depression takes Lexapro as pollen allergy ? ? ?Wt 210 lb (95.3 kg)   BMI 38.10 kg/m?  ? ? ?General appearance: Well-developed well-nourished no gross deformities ? ?Cardiovascular normal pulse and perfusion normal color without edema ? ?Neurologically no sensation loss or deficits or pathologic reflexes ? ?Psychological: Awake alert and oriented x3 mood and affect normal ? ?Skin no lacerations or ulcerations no nodularity no palpable masses, no erythema or nodularity ? ?Musculoskeletal: Pain tenderness swelling left heel pump bump normal Achilles strength ? ?Imaging outside image was done in January 17 [facility] ordered by Collene Mares, PA ? ?X-ray shows plantar spur and calcaneal spur no fracture foot alignment normal ? ?A/P ? ?Pump bump calcaneus with Achilles tendinitis ? ?Recommend prednisone for 10 days 10 mg 3 times daily with meloxicam to follow 7.5 mg daily ? ?Ice at night ? ?Return in 6 weeks ? ?Meds ordered this encounter  ?Medications  ? predniSONE (DELTASONE) 10 MG tablet  ?  Sig: Take 1 tablet (10 mg total) by mouth 3 (three) times daily for 10 days.  ?   Dispense:  30 tablet  ?  Refill:  0  ? meloxicam (MOBIC) 7.5 MG tablet  ?  Sig: Take 1 tablet (7.5 mg total) by mouth daily. Start meloxicam after 10 days  ?  Dispense:  28 tablet  ?  Refill:  1  ? ?Coding checklist new problem, outside images were reviewed and prescription management ?

## 2022-05-27 ENCOUNTER — Ambulatory Visit (INDEPENDENT_AMBULATORY_CARE_PROVIDER_SITE_OTHER): Payer: Medicaid Other | Admitting: Orthopedic Surgery

## 2022-05-27 DIAGNOSIS — M7662 Achilles tendinitis, left leg: Secondary | ICD-10-CM

## 2022-05-27 NOTE — Patient Instructions (Signed)
Continue meloxicam and wear a shoe with the back out

## 2022-05-27 NOTE — Progress Notes (Signed)
Chief Complaint  Patient presents with   Foot Pain    Achilles tendinitis, left leg Follow up//still painful but improving. Has good days and bad days   Encounter Diagnosis  Name Primary?   Achilles tendinitis, left leg Yes   61 year old female presents for reevaluation of Achilles tendinitis left ankle  She was treated with a dose of steroids and then meloxicam with a cam walker presents for reevaluation  She reports improvement with this treatment  She still has some soreness  On exam she has full range of motion no evidence of rupture of the Achilles she still tender on the back of the heel  Recommend continue meloxicam switch to an open heel shoe and follow-up in 6 weeks   RainI just make

## 2022-07-08 ENCOUNTER — Encounter: Payer: Self-pay | Admitting: Orthopedic Surgery

## 2022-07-08 ENCOUNTER — Ambulatory Visit (INDEPENDENT_AMBULATORY_CARE_PROVIDER_SITE_OTHER): Payer: Medicaid Other | Admitting: Orthopedic Surgery

## 2022-07-08 DIAGNOSIS — M7662 Achilles tendinitis, left leg: Secondary | ICD-10-CM | POA: Diagnosis not present

## 2022-07-08 MED ORDER — MELOXICAM 7.5 MG PO TABS: 7.5000 mg | ORAL_TABLET | Freq: Every day | ORAL | 5 refills | Status: DC

## 2022-07-08 NOTE — Progress Notes (Signed)
FOLLOW UP   Encounter Diagnosis  Name Primary?   Achilles tendinitis, left leg Yes     Chief Complaint  Patient presents with   Ankle Pain    Left Achilles  had some pain relief for a while but mowed last week and pain has returned, walks with a limp / ran out of Meloxicam     61 year old female with Achilles tendinitis seem to have gotten better after prior treatments but had to do some mowing on an incline and started having pain again  Exam shows that she is walking with just a slight limp she has tenderness right at the insertion of the Achilles with no palpable defect  Recommend return to meloxicam cam walker rest ice etc. follow-up if no improvement  Meds ordered this encounter  Medications   meloxicam (MOBIC) 7.5 MG tablet    Sig: Take 1 tablet (7.5 mg total) by mouth daily.    Dispense:  30 tablet    Refill:  5

## 2022-07-08 NOTE — Patient Instructions (Signed)
Go back in the boot Do not mow the incline  Take the Meloxicam

## 2022-08-19 ENCOUNTER — Ambulatory Visit (INDEPENDENT_AMBULATORY_CARE_PROVIDER_SITE_OTHER): Payer: Medicaid Other | Admitting: Orthopedic Surgery

## 2022-08-19 ENCOUNTER — Encounter: Payer: Self-pay | Admitting: Orthopedic Surgery

## 2022-08-19 DIAGNOSIS — M7662 Achilles tendinitis, left leg: Secondary | ICD-10-CM

## 2022-08-19 NOTE — Progress Notes (Signed)
FOLLOW UP   Encounter Diagnosis  Name Primary?   Achilles tendinitis, left leg Yes     Chief Complaint  Patient presents with   Ankle Problem    Fu achilles left better     Chief Complaint  Patient presents with   Ankle Problem    Fu achilles left better    61 year old female treated for Achilles tendinitis with rest, CAM Walker, meloxicam  Comes in today walking normally in regular shoes with an open back  Exam of the left foot normal range of motion no tenderness in the tendon mild tenderness over the pump pump  Recommend intermittent use of meloxicam as needed  Follow-up with [place] as needed.

## 2022-09-08 ENCOUNTER — Ambulatory Visit: Payer: Medicaid Other | Admitting: Orthopedic Surgery

## 2022-09-08 ENCOUNTER — Encounter: Payer: Self-pay | Admitting: Orthopedic Surgery

## 2022-09-08 DIAGNOSIS — M7662 Achilles tendinitis, left leg: Secondary | ICD-10-CM

## 2022-09-08 MED ORDER — METHYLPREDNISOLONE ACETATE 40 MG/ML IJ SUSP
40.0000 mg | Freq: Once | INTRAMUSCULAR | Status: AC
  Administered 2022-09-08: 40 mg via INTRA_ARTICULAR

## 2022-09-08 NOTE — Patient Instructions (Signed)
Wear cam walker for 4 week s  You have received an injection of steroids into the joint. 15% of patients will have increased pain within the 24 hours postinjection.   This is transient and will go away.   We recommend that you use ice packs on the injection site for 20 minutes every 2 hours and extra strength Tylenol 2 tablets every 8 as needed until the pain resolves.  If you continue to have pain after taking the Tylenol and using the ice please call the office for further instructions.

## 2022-09-08 NOTE — Progress Notes (Signed)
Chief Complaint  Patient presents with   Foot Pain    LT foot//few days after she was released the pain returned// 8/10 pain Painful all the time/increases with walking   Encounter Diagnosis  Name Primary?   Achilles tendinitis, left leg Yes    He is55 year old female multiple treatments for Achilles tendinitis comes in with recurrent foot pain primarily in the posterior aspect of the Achilles tendon she also has a pump bump deformity which is also bothering her.  Reexamination reveals the patient is walking with a slight limp she has tenderness over the skin area at the pump pump and then in the retrocalcaneal bursa with tenderness along the Achilles tendon in this area this is worsened by plantarflexion and dorsiflexion of the foot and ankle   Injection was suggested she agreed we injected the retrocalcaneal bursa   A steroid injection was performed at left ankle retrocalcaneal bursa using 1% plain Lidocaine and 40 mg of Depo-Medrol. This was well tolerated.   Return in 4 weeks.  Wear boot CAM Walker over the next 4 weeks

## 2022-09-21 ENCOUNTER — Other Ambulatory Visit (HOSPITAL_COMMUNITY): Payer: Self-pay | Admitting: Family Medicine

## 2022-09-21 DIAGNOSIS — Z1231 Encounter for screening mammogram for malignant neoplasm of breast: Secondary | ICD-10-CM

## 2022-10-11 ENCOUNTER — Ambulatory Visit: Payer: Medicaid Other | Admitting: Orthopedic Surgery

## 2022-10-11 ENCOUNTER — Encounter: Payer: Self-pay | Admitting: Orthopedic Surgery

## 2022-10-11 DIAGNOSIS — M7662 Achilles tendinitis, left leg: Secondary | ICD-10-CM | POA: Diagnosis not present

## 2022-10-11 NOTE — Progress Notes (Signed)
FOLLOW UP   Encounter Diagnosis  Name Primary?   Achilles tendinitis, left leg Yes     Chief Complaint  Patient presents with   Follow-up    Left achilles- feelIng better wearing my sandals today    61 year old female treated for Achilles tendinitis with pump pump currently doing well  She wore the cam walker and the symptoms got better she wore sandals still doing well  She has full range of motion without pain she has full plantarflexion strength without pain  Recommend stretching strengthening exercises follow-up if symptoms recur

## 2022-10-11 NOTE — Patient Instructions (Signed)
Complete your home exercises as directed.

## 2022-10-25 ENCOUNTER — Ambulatory Visit (HOSPITAL_COMMUNITY)
Admission: RE | Admit: 2022-10-25 | Discharge: 2022-10-25 | Disposition: A | Discharge status: Home / Self Care | Payer: Medicaid Other | Source: Ambulatory Visit | Attending: Family Medicine | Admitting: Family Medicine

## 2022-10-25 DIAGNOSIS — Z1231 Encounter for screening mammogram for malignant neoplasm of breast: Secondary | ICD-10-CM | POA: Diagnosis present

## 2022-11-15 ENCOUNTER — Ambulatory Visit (INDEPENDENT_AMBULATORY_CARE_PROVIDER_SITE_OTHER): Payer: Medicaid Other | Admitting: Adult Health

## 2022-11-15 ENCOUNTER — Encounter: Payer: Self-pay | Admitting: Adult Health

## 2022-11-15 VITALS — BP 127/70 | HR 68 | Ht 62.0 in | Wt 217.0 lb

## 2022-11-15 DIAGNOSIS — K649 Unspecified hemorrhoids: Secondary | ICD-10-CM | POA: Diagnosis not present

## 2022-11-15 DIAGNOSIS — Z1211 Encounter for screening for malignant neoplasm of colon: Secondary | ICD-10-CM

## 2022-11-15 DIAGNOSIS — Z0000 Encounter for general adult medical examination without abnormal findings: Secondary | ICD-10-CM

## 2022-11-15 DIAGNOSIS — L80 Vitiligo: Secondary | ICD-10-CM | POA: Diagnosis not present

## 2022-11-15 DIAGNOSIS — Z01419 Encounter for gynecological examination (general) (routine) without abnormal findings: Secondary | ICD-10-CM

## 2022-11-15 LAB — HEMOCCULT GUIAC POC 1CARD (OFFICE): Fecal Occult Blood, POC: NEGATIVE

## 2022-11-15 NOTE — Progress Notes (Signed)
Patient ID: Emma Pittman, female   DOB: [birth year], 61 y.o.   MRN: [id number] History of Present Illness: Emma Pittman is a 61 year old white female, widowed, PM in for a well woman gyn exam.  Last pap was negative HPV and malignancy 11/11/20.  PCP is Marisue Brooklyn PA.   Current Medications, Allergies, Past Medical History, Past Surgical History, Family History and Social History were reviewed in [employer] record.     Review of Systems:  Patient denies any headaches, hearing loss, fatigue, blurred vision, shortness of breath, chest pain, abdominal pain, problems with bowel movements, urination, or intercourse(not active). No joint pain or mood swings. Had food poisoning last week   Physical Exam:BP 127/70 (BP Location: Right Arm, Patient Position: Sitting, Cuff Size: Normal)   Pulse 68   Ht '5\' 2"'$  (1.575 m)   Wt 217 lb (98.4 kg)   BMI 39.69 kg/m   General:  Well developed, well nourished, no acute distress Skin:  Warm and dry Neck:  Midline trachea, normal thyroid, good ROM, no lymphadenopathy Lungs; Clear to auscultation bilaterally Breast:  No dominant palpable mass, retraction, or nipple discharge, has vitiligo under breast  Cardiovascular: Regular rate and rhythm Abdomen:  Soft, non tender, no hepatosplenomegaly Pelvic:  External genitalia is normal in appearance, has vitiligo and is red from wiping when had diarrhea. The vagina is pale. Urethra has no lesions or masses. The cervix is smooth.  Uterus is felt to be normal size, shape, and contour.  No adnexal masses or tenderness noted.Bladder is non tender, no masses felt. Rectal: Good sphincter tone, no polyps, + hemorrhoids felt.  Hemoccult negative. Extremities/musculoskeletal:  No swelling or varicosities noted, no clubbing or cyanosis Psych:  No mood changes, alert and cooperative,seems happy AA is 0 Fall risk is low    11/15/2022    8:36 AM 11/12/2021    8:35 AM 11/11/2020    1:35 PM  Depression  screen PHQ 2/9  Decreased Interest '2 2 3  '$ Down, Depressed, Hopeless '1 1 3  '$ PHQ - 2 Score '3 3 6  '$ Altered sleeping '2 3 3  '$ Tired, decreased energy '1 1 2  '$ Change in appetite '2 1 3  '$ Feeling bad or failure about yourself  0 0 2  Trouble concentrating 0 1 2  Moving slowly or fidgety/restless 0 0 2  Suicidal thoughts 0 0 0  PHQ-9 Score '8 9 20   '$ On lexapro 20 mg 1 daily and ativan prn     11/15/2022    8:37 AM 11/12/2021    8:35 AM 11/11/2020    1:35 PM  GAD 7 : Generalized Anxiety Score  Nervous, Anxious, on Edge '1 1 2  '$ Control/stop worrying '1 1 2  '$ Worry too much - different things '1 1 2  '$ Trouble relaxing '1 1 2  '$ Restless '1 1 2  '$ Easily annoyed or irritable 0 1 2  Afraid - awful might happen 0 1 0  Total GAD 7 Score '5 7 12      '$ [facility] - 11/15/22 0835       Pregnancy Intention Screening   Does the patient want to become pregnant in the next year? No    Does the patient's partner want to become pregnant in the next year? No    Would the patient like to discuss contraceptive options today? No      Contraception Wrap Up   Current Method Female Sterilization    Reason for No Current Contraceptive Method at  Intake ([facility] Only) Other   ablation and post menopause   End Method Female Sterilization    Contraception Counseling Provided No             Examination chaperoned by Celene Squibb LPN  Impression and Plan: 1. Encounter for well woman exam with routine gynecological exam Pap and physical with labs next year Had normal  mammogram 10/25/22 Colonoscopy per GI  2. Encounter for screening fecal occult blood testing Hemoccult was negative  3. Vitiligo  4. Hemorrhoids, unspecified hemorrhoid type

## 2023-01-19 ENCOUNTER — Other Ambulatory Visit (HOSPITAL_COMMUNITY): Payer: Self-pay | Admitting: Internal Medicine

## 2023-01-19 ENCOUNTER — Ambulatory Visit (HOSPITAL_COMMUNITY)
Admission: RE | Admit: 2023-01-19 | Discharge: 2023-01-19 | Disposition: A | Discharge status: Home / Self Care | Payer: Medicaid Other | Source: Ambulatory Visit | Attending: Internal Medicine | Admitting: Internal Medicine

## 2023-01-19 DIAGNOSIS — R002 Palpitations: Secondary | ICD-10-CM | POA: Insufficient documentation

## 2023-01-19 DIAGNOSIS — R0600 Dyspnea, unspecified: Secondary | ICD-10-CM | POA: Diagnosis present

## 2023-02-10 ENCOUNTER — Encounter: Payer: Self-pay | Admitting: Radiology

## 2023-02-14 ENCOUNTER — Encounter: Payer: Self-pay | Admitting: Internal Medicine

## 2023-02-14 ENCOUNTER — Ambulatory Visit: Payer: Medicare Other | Attending: Internal Medicine | Admitting: Internal Medicine

## 2023-02-14 VITALS — BP 106/78 | HR 85 | Ht 62.0 in | Wt 222.0 lb

## 2023-02-14 DIAGNOSIS — R5383 Other fatigue: Secondary | ICD-10-CM | POA: Diagnosis present

## 2023-02-14 DIAGNOSIS — E785 Hyperlipidemia, unspecified: Secondary | ICD-10-CM | POA: Insufficient documentation

## 2023-02-14 DIAGNOSIS — R0602 Shortness of breath: Secondary | ICD-10-CM | POA: Insufficient documentation

## 2023-02-14 DIAGNOSIS — R0609 Other forms of dyspnea: Secondary | ICD-10-CM | POA: Diagnosis present

## 2023-02-14 DIAGNOSIS — R011 Cardiac murmur, unspecified: Secondary | ICD-10-CM | POA: Insufficient documentation

## 2023-02-14 DIAGNOSIS — I10 Essential (primary) hypertension: Secondary | ICD-10-CM | POA: Diagnosis present

## 2023-02-14 DIAGNOSIS — R002 Palpitations: Secondary | ICD-10-CM | POA: Insufficient documentation

## 2023-02-14 DIAGNOSIS — G4733 Obstructive sleep apnea (adult) (pediatric): Secondary | ICD-10-CM | POA: Diagnosis present

## 2023-02-14 DIAGNOSIS — E7849 Other hyperlipidemia: Secondary | ICD-10-CM | POA: Insufficient documentation

## 2023-02-14 DIAGNOSIS — R29818 Other symptoms and signs involving the nervous system: Secondary | ICD-10-CM | POA: Insufficient documentation

## 2023-02-14 DIAGNOSIS — R0683 Snoring: Secondary | ICD-10-CM | POA: Diagnosis present

## 2023-02-14 NOTE — Progress Notes (Signed)
Cardiology Office Note  Date: 02/14/2023   ID: Emma Pittman, DOB August 25, 1961, MRN [id number]  PCP:  Emma Samples, PA-C  Cardiologist:  Emma Guest, MD Electrophysiologist:  None   Reason for Office Visit: Evaluation of dyspnea at the request of Emma Pittman   History of Present Illness: Emma Pittman is a 62 y.o. female known to have HTN was referred to cardiology clinic for evaluation of dyspnea.  Patient had COVID-19 infection in 11/2022 after which she had fatigue and DOE which was improving. She also had chest discomfort at the time of COVID-19 infection but did not have any recurrence of chest pains after that. Initially after COVID recovery, she had to take multiple breaks to perform her household chores but she is able to perform all her ADLs and household chores with no issues recently. She also has palpitations since COVID-19 infection, not frequently.  Has dizziness once in a while but not frequently.  No syncope, leg swelling.  No family history of premature ASCVD.  Denies smoking cigarettes, has secondhand exposure to cigarette smoke.  Was never tested for OSA, STOP-BANG score is 5.  Snores at nighttime, feels sleepy sometimes during the daytime, insomnia and currently treated for HTN.  Past Medical History:  Diagnosis Date   Hemorrhoids 03/22/2016   Hypercholesteremia    Hypertension    PONV (postoperative nausea and vomiting)    Spotting 03/06/2014   Vertigo    Vitiligo     Past Surgical History:  Procedure Laterality Date   APPENDECTOMY  age 22   CATARACT EXTRACTION W/PHACO  08/19/2011   Procedure: CATARACT EXTRACTION PHACO AND INTRAOCULAR LENS PLACEMENT (Emma Pittman);  Surgeon: Emma Pittman;  Location: [facility];  Service: Ophthalmology;  Laterality: Right;  CDE:9.50   CATARACT EXTRACTION W/PHACO  09/06/2011   Procedure: CATARACT EXTRACTION PHACO AND INTRAOCULAR LENS PLACEMENT ([facility]);  Surgeon: Emma Pittman;  Location: [facility];  Service: Ophthalmology;   Laterality: Left;  CDE: 5.25   COLONOSCOPY     COLONOSCOPY N/A 09/08/2018   Procedure: COLONOSCOPY;  Surgeon: Emma Binder, MD;  Location: [facility];  Service: Endoscopy;  Laterality: N/A;  1:00   ENDOMETRIAL ABLATION  8 yrs ago-eure   aph   POLYPECTOMY  09/08/2018   Procedure: POLYPECTOMY;  Surgeon: Emma Binder, MD;  Location: [facility];  Service: Endoscopy;;  colon   TUBAL LIGATION  15 yrs & 10 yrs    aph-Dr Ferguson:also had dye studies of tubes with opening of one tube    Current Outpatient Medications  Medication Sig Dispense Refill   acetaminophen (TYLENOL) 500 MG tablet Take 1,000 mg by mouth every 6 (six) hours as needed for moderate pain or headache.     atorvastatin (LIPITOR) 10 MG tablet Take 10 mg by mouth every morning.      escitalopram (LEXAPRO) 10 MG tablet Take 1 tablet by mouth once daily (Patient taking differently: Take 20 tablets by mouth daily.) 30 tablet 3   lisinopril-hydrochlorothiazide (PRINZIDE,ZESTORETIC) 10-12.5 MG per tablet Take 1 tablet by mouth every morning.      LORazepam (ATIVAN) 0.5 MG tablet TAKE 1 TABLET BY MOUTH EVERY 8 HOURS AS NEEDED FOR ANXIETY 15 tablet 0   meclizine (ANTIVERT) 25 MG tablet Take 25 mg by mouth 3 (three) times daily as needed for dizziness.     No current facility-administered medications for this visit.   Facility-Administered Medications Ordered in Other Visits  Medication Dose Route Frequency Provider Last Rate Last  Admin   fentaNYL (SUBLIMAZE) injection 25-50 mcg  25-50 mcg Intravenous Q5 min PRN Emma Liner, MD       lactated ringers infusion   Intravenous Continuous Emma Liner, MD       Allergies:  Patient has no known allergies.   Social History: The patient  reports that she quit smoking about 31 years ago. Her smoking use included cigarettes. She has a 2.00 pack-year smoking history. She has never used smokeless tobacco. She reports that she does not currently use alcohol. She reports that she does  not use drugs.   Family History: The patient's family history includes Cancer in her mother; Congestive Heart Failure in her brother and father; Heart attack in her mother; Kidney failure in her father.   ROS:  Please see the history of present illness. Otherwise, complete review of systems is positive for none.  All other systems are reviewed and negative.   Physical Exam: VS:  BP 106/78   Pulse 85   Ht '5\' 2"'$  (1.575 m)   Wt 222 lb (100.7 kg)   SpO2 97%   BMI 40.60 kg/m , BMI Body mass index is 40.6 kg/m.  Wt Readings from Last 3 Encounters:  02/14/23 222 lb (100.7 kg)  11/15/22 217 lb (98.4 kg)  04/15/22 210 lb (95.3 kg)    General: Patient appears comfortable at rest. HEENT: Conjunctiva and lids normal, oropharynx clear with moist mucosa. Neck: Supple, no elevated JVP or carotid bruits, no thyromegaly. Lungs: Clear to auscultation, nonlabored breathing at rest. Cardiac: Regular rate and rhythm, no S3 or significant systolic murmur, no pericardial rub. Abdomen: Soft, nontender, no hepatomegaly, bowel sounds present, no guarding or rebound. Extremities: No pitting edema, distal pulses 2+. Skin: Warm and dry. Musculoskeletal: No kyphosis. Neuropsychiatric: Alert and oriented x3, affect grossly appropriate.  ECG:  NSR  Recent Labwork: No results found for requested labs within last 365 days.     Component Value Date/Time   CHOL 204 (H) 11/12/2021 0917   TRIG 63 11/12/2021 0917   HDL 71 11/12/2021 0917   CHOLHDL 2.9 11/12/2021 0917   CHOLHDL 3.4 08/03/2009 2230   VLDL 9 08/03/2009 2230   LDLCALC 122 (H) 11/12/2021 0917    Other Studies Reviewed Today:   Assessment and Plan: Patient is a 62 year old F known to have HTN was referred to cardiology clinic for evaluation of DOE.  # DOE -Patient has been having DOE since COVID-19 infection in 11/2022 which is improving recently. She uses inhalers which helps her SOB. Obtain PFTs. Obtain 2D echocardiogram. If PFTs are  normal, she will benefit from nuclear stress test.  # Systolic murmur, grade 99991111 -Patient has systolic murmur, grade [id number] on physical examination best heard in the right precordial area.  Will obtain 2D echocardiogram.  # OSA screening -Has symptoms of OSA, insomnia, snoring, feels sleepy/tired, treated for HTN, STOP-BANG score is 5.  Refused home sleep study. Pulmonary referral for sleep study.  # HTN, controlled -Continue lisinopril-HCTZ 10-12.5 mg once daily -HTN management per PCP  # HLD -Continue atorvastatin 10 mg daily -Management of HLD per PCP  I have spent a total of 45 minutes with patient reviewing chart, EKGs, labs and examining patient as well as establishing an assessment and plan that was discussed with the patient.  > 50% of time was spent in direct patient care.      Medication Adjustments/Labs and Tests Ordered: Current medicines are reviewed at length with the patient today.  Concerns regarding medicines  are outlined above.   Tests Ordered: Orders Placed This Encounter  Procedures   Ambulatory referral to Pulmonology   EKG 12-Lead   ECHOCARDIOGRAM COMPLETE   Pulmonary Function Test     Medication Changes: No orders of the defined types were placed in this encounter.   Disposition:  Follow up  one year  Signed Ilham Roughton Fidel Levy, MD, 02/14/2023 9:36 AM    [facility], [place], [place] [address]

## 2023-02-14 NOTE — Patient Instructions (Addendum)
Medication Instructions:  Your physician recommends that you continue on your current medications as directed. Please refer to the Current Medication list given to you today.  Labwork: none  Testing/Procedures: Your physician has requested that you have an echocardiogram. Echocardiography is a painless test that uses sound waves to create images of your heart. It provides your doctor with information about the size and shape of your heart and how well your heart's chambers and valves are working. This procedure takes approximately one hour. There are no restrictions for this procedure. Please do NOT wear cologne, perfume, aftershave, or lotions (deodorant is allowed). Please arrive 15 minutes prior to your appointment time. Your physician has recommended that you have a pulmonary function test. Pulmonary Function Tests are a group of tests that measure how well air moves in and out of your lungs.  Follow-Up: Your physician recommends that you schedule a follow-up appointment in: 1 year. You will receive a reminder call in the mail in about 10 months reminding you to call and schedule your appointment. If you don't receive this call, please contact our office.  Any Other Special Instructions Will Be Listed Below (If Applicable). You have been referred to Pulmonology  If you need a refill on your cardiac medications before your next appointment, please call your pharmacy.

## 2023-02-28 ENCOUNTER — Ambulatory Visit: Payer: Medicare Other | Attending: Internal Medicine

## 2023-02-28 DIAGNOSIS — R0602 Shortness of breath: Secondary | ICD-10-CM | POA: Insufficient documentation

## 2023-02-28 LAB — ECHOCARDIOGRAM COMPLETE
AR max vel: 1.87 cm2
AV Peak grad: 13.7 mmHg
Ao pk vel: 1.85 m/s
Area-P 1/2: 2.24 cm2
Calc EF: 69.2 %
S' Lateral: 3 cm
Single Plane A2C EF: 72.3 %
Single Plane A4C EF: 66.8 %

## 2023-04-14 ENCOUNTER — Encounter: Payer: Self-pay | Admitting: Pulmonary Disease

## 2023-04-14 ENCOUNTER — Ambulatory Visit (INDEPENDENT_AMBULATORY_CARE_PROVIDER_SITE_OTHER): Payer: 59 | Admitting: Pulmonary Disease

## 2023-04-14 VITALS — BP 134/74 | HR 71 | Ht 62.0 in | Wt 219.0 lb

## 2023-04-14 DIAGNOSIS — R0609 Other forms of dyspnea: Secondary | ICD-10-CM | POA: Diagnosis not present

## 2023-04-14 DIAGNOSIS — G4733 Obstructive sleep apnea (adult) (pediatric): Secondary | ICD-10-CM | POA: Diagnosis not present

## 2023-04-14 MED ORDER — TRAZODONE HCL 50 MG PO TABS: 50.0000 mg | ORAL_TABLET | Freq: Every day | ORAL | 1 refills | Status: DC

## 2023-04-14 NOTE — Assessment & Plan Note (Signed)
There is no evidence of post-COVID fibrosis on chest x-ray. There is no history to suggest asthma.  Cardiac evaluation is normal She feels she is 70% improved and is slowly increasing her exercise tolerance Best to take a wait and watch approach here

## 2023-04-14 NOTE — Progress Notes (Signed)
Subjective:    Patient ID: Emma Pittman, female    DOB: 06/24/1961, 62 y.o.   MRN: [id number]  HPI  Chief Complaint  Patient presents with   Sleep Consult    Referred by Dr. Jenene Slicker. Pt c/o feeling restless, can not fall asleep well or stay asleep. She has never had sleep study   63 year old never smoker referred by cardiology for evaluation of dyspnea, insomnia and sleep disordered breathing Her husband of 40 years died 3 years ago with muscular dystrophy and she is still grieving She had COVID infection December 2023 and feels that she has still not recovered from it she reports dyspnea on exertion.  She never palpitations and underwent cardiology evaluation.  Echo was normal.  A murmur was auscultated but no valvular abnormalities were detected.  Chest x-ray 01/2023 nodular infiltrates or effusions Has symptoms of OSA, insomnia, snoring, feels sleepy/tired, treated for HTN, STOP-BANG score is 5-and she is referred to [place] for sleep evaluation  Her main complaint is difficulty falling asleep.  Epworth sleepiness score is 3.  She denies choking gasping episodes in her sleep she will bed partner history is available.  Snoring has been noted by family members.  She reports restless sleep and feeling tired during the daytime. Bedtime is between 9 and 11 PM, it takes her 2 to 3 hours to fall asleep.  She sleeps on the left side with 1 pillow and reports frequent nocturnal awakenings, mostly spontaneous with no nocturia.  She sometimes lies in bed until 9 AM wakes up feeling tired without dryness of mouth or headaches. There is no history suggestive of cataplexy, sleep paralysis or parasomnias  She reports sadness of mood and is tearful during the interview when we talk about her deceased husband    Past Medical History:  Diagnosis Date   Hemorrhoids 03/22/2016   Hypercholesteremia    Hypertension    PONV (postoperative nausea and vomiting)    Spotting 03/06/2014   Vertigo     Vitiligo    Past Surgical History:  Procedure Laterality Date   APPENDECTOMY  age 59   CATARACT EXTRACTION W/PHACO  08/19/2011   Procedure: CATARACT EXTRACTION PHACO AND INTRAOCULAR LENS PLACEMENT (IOC);  Surgeon: Gemma Payor;  Location: [facility];  Service: Ophthalmology;  Laterality: Right;  CDE:9.50   CATARACT EXTRACTION W/PHACO  09/06/2011   Procedure: CATARACT EXTRACTION PHACO AND INTRAOCULAR LENS PLACEMENT (I[facility]);  Surgeon: Gemma Payor;  Location: [facility];  Service: Ophthalmology;  Laterality: Left;  CDE: 5.25   COLONOSCOPY     COLONOSCOPY N/A 09/08/2018   Procedure: COLONOSCOPY;  Surgeon: West Bali, MD;  [facility]: [facility];  Service: Endoscopy;  Laterality: N/A;  1:00   ENDOMETRIAL ABLATION  8 yrs ago-eure   aph   POLYPECTOMY  09/08/2018   Procedure: POLYPECTOMY;  Surgeon: West Bali, MD;  Location: [facility];  Service: Endoscopy;;  colon   TUBAL LIGATION  15 yrs & 10 yrs    aph-Dr Ferguson:also had dye studies of tubes with opening of one tube    No Known Allergies  Social History   Socioeconomic History   Marital status: Widowed    Spouse name: Not on file   Number of children: Not on file   Years of education: Not on file   Highest education level: Not on file  Occupational History   Not on file  Tobacco Use   Smoking status: Former    Packs/day: 0.50    Years: 4.00  Additional pack years: 0.00    Total pack years: 2.00    Types: Cigarettes    Quit date: 08/12/1991    Years since quitting: 31.6   Smokeless tobacco: Never  Vaping Use   Vaping Use: Never used  Substance and Sexual Activity   Alcohol use: Not Currently    Comment: occassional   Drug use: No   Sexual activity: Not Currently    Birth control/protection: Surgical, Post-menopausal    Comment: tubal and ablation  Other Topics Concern   Not on file  Social History Narrative   Not on file   Social Determinants of Health   Financial Resource Strain: Low Risk  (11/15/2022)   Overall  Financial Resource Strain (CARDIA)    Difficulty of Paying Living Expenses: Not hard at all  Food Insecurity: No Food Insecurity (11/15/2022)   Hunger Vital Sign    Worried About Running Out of Food in the Last Year: Never true    Ran Out of Food in the Last Year: Never true  Transportation Needs: No Transportation Needs (11/15/2022)   PRAPARE - Administrator, Civil Service (Medical): No    Lack of Transportation (Non-Medical): No  Physical Activity: Insufficiently Active (11/15/2022)   Exercise Vital Sign    Days of Exercise per Week: 4 days    Minutes of Exercise per Session: 30 min  Stress: Stress Concern Present (11/15/2022)   [employer] of Occupational Health - Occupational Stress Questionnaire    Feeling of Stress : To some extent  Social Connections: Socially Isolated (11/15/2022)   Social Connection and Isolation Panel [NHANES]    Frequency of Communication with Friends and Family: More than three times a week    Frequency of Social Gatherings with Friends and Family: More than three times a week    Attends Religious Services: Never    Database administrator or Organizations: No    Attends Banker Meetings: Never    Marital Status: Widowed  Intimate Partner Violence: Not At Risk (11/15/2022)   Humiliation, Afraid, Rape, and Kick questionnaire    Fear of Current or Ex-Partner: No    Emotionally Abused: No    Physically Abused: No    Sexually Abused: No    Family History  Problem Relation Age of Onset   Heart attack Mother    Cancer Mother        colon   Kidney failure Father    Congestive Heart Failure Father    Congestive Heart Failure Brother        colon polyps   Anesthesia problems Neg Hx    Hypotension Neg Hx    Malignant hyperthermia Neg Hx    Pseudochol deficiency Neg Hx      Review of Systems Sadness of mood , Tearful  Constitutional: negative for anorexia, fevers and sweats  Eyes: negative for irritation, redness and  visual disturbance  Ears, nose, mouth, throat, and face: negative for earaches, epistaxis, nasal congestion and sore throat  Respiratory: negative for cough, sputum and wheezing  Cardiovascular: negative for chest pain, dyspnea, lower extremity edema, orthopnea, palpitations and syncope  Gastrointestinal: negative for abdominal pain, constipation, diarrhea, melena, nausea and vomiting  Genitourinary:negative for dysuria, frequency and hematuria  Hematologic/lymphatic: negative for bleeding, easy bruising and lymphadenopathy  Musculoskeletal:negative for arthralgias, muscle weakness and stiff joints  Neurological: negative for coordination problems, gait problems, headaches and weakness  Endocrine: negative for diabetic symptoms including polydipsia, polyuria and weight loss  Objective:   Physical Exam  Gen. Pleasant, obese, in no distress ENT - no lesions, no post nasal drip Neck: No JVD, no thyromegaly, no carotid bruits Lungs: no use of accessory muscles, no dullness to percussion, decreased without rales or rhonchi  Cardiovascular: Rhythm regular, heart sounds  normal, no murmurs or gallops, no peripheral edema Musculoskeletal: No deformities, no cyanosis or clubbing , no tremors       Assessment & Plan:

## 2023-04-14 NOTE — Assessment & Plan Note (Addendum)
The main issue seems to be insomnia, related to grief and at this point certainly an element of depression.  She is on Lexapro in the daytime.  She seems to have difficulty WITH sleep onset as well as sleep maintenance. She will do well with an antidepressant or sleep inducing properties such as Remeron or trazodone  Initiate trazodone 50 mg and titrate to 100 mg to effect. She will follow-up with PCP for this.  Certainly insomnia will need to be treated before we can initiate testing for therapy for sleep disordered breathing.  She does not have any red flags of choking or gasping episode & we will defer  the testing for sleep apnea

## 2023-04-14 NOTE — Patient Instructions (Signed)
Trial of Trazodone 50 mg at bedtime for insomnia & depression If no effect, ok to increase to 2 tabs in 1 week  Report back in 2 weeks

## 2023-04-14 NOTE — Addendum Note (Signed)
Addended by: Oretha Milch on: 04/14/2023 09:03 AM   Modules accepted: Orders

## 2023-06-21 ENCOUNTER — Ambulatory Visit (HOSPITAL_COMMUNITY)
Admission: RE | Admit: 2023-06-21 | Discharge: 2023-06-21 | Disposition: A | Discharge status: Home / Self Care | Payer: 59 | Source: Ambulatory Visit | Attending: Internal Medicine | Admitting: Internal Medicine

## 2023-06-21 DIAGNOSIS — R0602 Shortness of breath: Secondary | ICD-10-CM | POA: Diagnosis not present

## 2023-06-21 LAB — PULMONARY FUNCTION TEST
DL/VA % pred: 116 %
DL/VA: 5.01 ml/min/mmHg/L
DLCO unc % pred: 109 %
DLCO unc: 20.6 ml/min/mmHg
FEF 25-75 Post: 2.24 L/sec
FEF 25-75 Pre: 2.27 L/sec
FEF2575-%Change-Post: -1 %
FEF2575-%Pred-Post: 98 %
FEF2575-%Pred-Pre: 99 %
FEV1-%Change-Post: 0 %
FEV1-%Pred-Post: 96 %
FEV1-%Pred-Pre: 96 %
FEV1-Post: 2.31 L
FEV1-Pre: 2.32 L
FEV1FVC-%Change-Post: 0 %
FEV1FVC-%Pred-Pre: 101 %
FEV6-%Change-Post: -1 %
FEV6-%Pred-Post: 95 %
FEV6-%Pred-Pre: 97 %
FEV6-Post: 2.85 L
FEV6-Pre: 2.9 L
FEV6FVC-%Change-Post: 0 %
FEV6FVC-%Pred-Post: 102 %
FEV6FVC-%Pred-Pre: 103 %
FVC-%Change-Post: 0 %
FVC-%Pred-Post: 93 %
FVC-%Pred-Pre: 94 %
FVC-Post: 2.88 L
FVC-Pre: 2.91 L
Post FEV1/FVC ratio: 80 %
Post FEV6/FVC ratio: 99 %
Pre FEV1/FVC ratio: 80 %
Pre FEV6/FVC Ratio: 100 %
RV % pred: 113 %
RV: 2.13 L
TLC % pred: 107 %
TLC: 5.1 L

## 2023-06-21 MED ORDER — ALBUTEROL SULFATE (2.5 MG/3ML) 0.083% IN NEBU
2.5000 mg | INHALATION_SOLUTION | Freq: Once | RESPIRATORY_TRACT | Status: AC
  Administered 2023-06-21: 2.5 mg via RESPIRATORY_TRACT

## 2023-07-04 ENCOUNTER — Telehealth: Payer: Self-pay

## 2023-07-04 NOTE — Telephone Encounter (Signed)
Patient informed. Patient verbalized understanding. Sent to PCP

## 2023-07-04 NOTE — Telephone Encounter (Signed)
-----   Message from Vishnu P Mallipeddi sent at 06/29/2023  9:28 AM EDT ----- Normal lung function test except for low ERV consistent with body habitus. DOE being anginal equivalent, can schedule NM stress test.

## 2023-08-31 ENCOUNTER — Encounter: Payer: Self-pay | Admitting: *Deleted

## 2023-09-13 DIAGNOSIS — Z23 Encounter for immunization: Secondary | ICD-10-CM | POA: Diagnosis not present

## 2023-10-17 ENCOUNTER — Other Ambulatory Visit (HOSPITAL_COMMUNITY): Payer: Self-pay | Admitting: Internal Medicine

## 2023-10-17 DIAGNOSIS — Z1231 Encounter for screening mammogram for malignant neoplasm of breast: Secondary | ICD-10-CM

## 2023-10-20 ENCOUNTER — Telehealth: Payer: Self-pay | Admitting: *Deleted

## 2023-10-20 DIAGNOSIS — Z860100 Personal history of colon polyps, unspecified: Secondary | ICD-10-CM

## 2023-10-20 NOTE — Telephone Encounter (Signed)
  Procedure: colonoscopy  Height: 5'2" Weight: 219 lb        Have you had a colonoscopy before?  Yes, Dr.Fields, 2019  Do you have family history of colon cancer?  Yes, mother  Do you have a family history of polyps? unsure  Previous colonoscopy with polyps removed? yes  Do you have a history colorectal cancer?   no  Are you diabetic?  no  Do you have a prosthetic or mechanical heart valve? no  Do you have a pacemaker/defibrillator?   no  Have you had endocarditis/atrial fibrillation?  no  Do you use supplemental oxygen/CPAP?  no  Have you had joint replacement within the last 12 months?  no  Do you tend to be constipated or have to use laxatives?  no   Do you have history of alcohol use? If yes, how much and how often.  no  Do you have history or are you using drugs? If yes, what do are you  using?  no  Have you ever had a stroke/heart attack?  no  Have you ever had a heart or other vascular stent placed,?no  Do you take weight loss medication? no  female patients,: have you had a hysterectomy? no                              are you post menopausal?  No answer                              do you still have your menstrual cycle? no    Date of last menstrual period? unsure  Do you take any blood-thinning medications such as: (Plavix, aspirin, Coumadin, Aggrenox, Brilinta, Xarelto, Eliquis, Pradaxa, Savaysa or Effient)? no  If yes we need the name, milligram, dosage and who is prescribing doctor:  n/a             Current Outpatient Medications  Medication Sig Dispense Refill   acetaminophen (TYLENOL) 500 MG tablet Take 1,000 mg by mouth every 6 (six) hours as needed for moderate pain or headache.     atorvastatin (LIPITOR) 10 MG tablet Take 10 mg by mouth every morning.      escitalopram (LEXAPRO) 10 MG tablet Take 1 tablet by mouth once daily (Patient taking differently: Take 20 tablets by mouth daily.) 30 tablet 3   lisinopril-hydrochlorothiazide  (PRINZIDE,ZESTORETIC) 10-12.5 MG per tablet Take 1 tablet by mouth every morning.      LORazepam (ATIVAN) 0.5 MG tablet TAKE 1 TABLET BY MOUTH EVERY 8 HOURS AS NEEDED FOR ANXIETY 15 tablet 0   meclizine (ANTIVERT) 25 MG tablet Take 25 mg by mouth 3 (three) times daily as needed for dizziness.     No current facility-administered medications for this visit.   Facility-Administered Medications Ordered in Other Visits  Medication Dose Route Frequency Provider Last Rate Last Admin   fentaNYL (SUBLIMAZE) injection 25-50 mcg  25-50 mcg Intravenous Q5 min PRN Laurene Footman, MD       lactated ringers infusion   Intravenous Continuous Laurene Footman, MD        No Known Allergies

## 2023-10-28 ENCOUNTER — Ambulatory Visit (HOSPITAL_COMMUNITY)
Admission: RE | Admit: 2023-10-28 | Discharge: 2023-10-28 | Disposition: A | Discharge status: Home / Self Care | Payer: 59 | Source: Ambulatory Visit | Attending: Internal Medicine | Admitting: Internal Medicine

## 2023-10-28 DIAGNOSIS — Z1231 Encounter for screening mammogram for malignant neoplasm of breast: Secondary | ICD-10-CM | POA: Diagnosis not present

## 2023-11-22 NOTE — Telephone Encounter (Signed)
ASA 3 only due to BMI of 40 - okay for room 1/2.   BMP pre-op

## 2023-11-23 MED ORDER — PEG 3350-KCL-NA BICARB-NACL 420 G PO SOLR: 4000.0000 mL | Freq: Once | ORAL | 0 refills | Status: AC

## 2023-11-23 NOTE — Telephone Encounter (Signed)
Questionnaire from recall, no referral needed  

## 2023-11-23 NOTE — Addendum Note (Signed)
Addended by: Armstead Peaks on: 11/23/2023 01:49 PM   Modules accepted: Orders

## 2023-11-23 NOTE — Telephone Encounter (Signed)
SPOKE WITH PT. Scheduled for 1/24 with Dr. Marletta Lor. Aware will send rx to pharmacy. Will mail instructions/lab work needed

## 2023-11-30 ENCOUNTER — Encounter: Payer: Self-pay | Admitting: Adult Health

## 2023-11-30 ENCOUNTER — Ambulatory Visit (INDEPENDENT_AMBULATORY_CARE_PROVIDER_SITE_OTHER): Payer: 59 | Admitting: Adult Health

## 2023-11-30 ENCOUNTER — Other Ambulatory Visit (HOSPITAL_COMMUNITY)
Admission: RE | Admit: 2023-11-30 | Discharge: 2023-11-30 | Disposition: A | Discharge status: Home / Self Care | Payer: 59 | Source: Ambulatory Visit | Attending: Adult Health | Admitting: Adult Health

## 2023-11-30 VITALS — BP 145/76 | HR 66 | Ht 62.0 in | Wt 219.5 lb

## 2023-11-30 DIAGNOSIS — K649 Unspecified hemorrhoids: Secondary | ICD-10-CM

## 2023-11-30 DIAGNOSIS — L80 Vitiligo: Secondary | ICD-10-CM | POA: Diagnosis not present

## 2023-11-30 DIAGNOSIS — R87610 Atypical squamous cells of undetermined significance on cytologic smear of cervix (ASC-US): Secondary | ICD-10-CM | POA: Insufficient documentation

## 2023-11-30 DIAGNOSIS — Z1211 Encounter for screening for malignant neoplasm of colon: Secondary | ICD-10-CM

## 2023-11-30 DIAGNOSIS — F329 Major depressive disorder, single episode, unspecified: Secondary | ICD-10-CM

## 2023-11-30 DIAGNOSIS — Z1331 Encounter for screening for depression: Secondary | ICD-10-CM | POA: Diagnosis not present

## 2023-11-30 DIAGNOSIS — Z01419 Encounter for gynecological examination (general) (routine) without abnormal findings: Secondary | ICD-10-CM | POA: Diagnosis present

## 2023-11-30 DIAGNOSIS — R87810 Cervical high risk human papillomavirus (HPV) DNA test positive: Secondary | ICD-10-CM | POA: Insufficient documentation

## 2023-11-30 DIAGNOSIS — Z0000 Encounter for general adult medical examination without abnormal findings: Secondary | ICD-10-CM

## 2023-11-30 DIAGNOSIS — E7800 Pure hypercholesterolemia, unspecified: Secondary | ICD-10-CM

## 2023-11-30 DIAGNOSIS — Z1151 Encounter for screening for human papillomavirus (HPV): Secondary | ICD-10-CM | POA: Diagnosis not present

## 2023-11-30 DIAGNOSIS — Z01411 Encounter for gynecological examination (general) (routine) with abnormal findings: Secondary | ICD-10-CM | POA: Insufficient documentation

## 2023-11-30 LAB — HEMOCCULT GUIAC POC 1CARD (OFFICE): Fecal Occult Blood, POC: NEGATIVE

## 2023-11-30 NOTE — Progress Notes (Signed)
Patient ID: Emma Pittman, female   DOB: 04-29-1961, 62 y.o.   MRN: [id number] History of Present Illness: Emma Pittman is a 62 year old white female, widowed, PM in for a well woman gyn exam and pap.  She has had vertigo recently and GI bug and tendonitis in heel, all in last 2 weeks. Has good days and bad days, still misses her husband.   PCP is Terie Purser PA   Current Medications, Allergies, Past Medical History, Past Surgical History, Family History and Social History were reviewed in [employer] record.     Review of Systems: Patient denies any headaches, hearing loss, fatigue, blurred vision, shortness of breath, chest pain, abdominal pain, problems with bowel movements, urination, or intercourse.(Not active) No joint pain or mood swings.  See HPI for positives   Physical Exam:BP (!) 145/76 (BP Location: Right Arm, Patient Position: Sitting, Cuff Size: Large)   Pulse 66   Ht 5\' 2"  (1.575 m)   Wt 219 lb 8 oz (99.6 kg)   LMP  (LMP Unknown) Comment: No periods after ablation  BMI 40.15 kg/m   General:  Well developed, well nourished, no acute distress Skin:  Warm and dry Neck:  Midline trachea, normal thyroid, good ROM, no lymphadenopathy, no carotid bruits heard  Lungs; Clear to auscultation bilaterally Breast:  No dominant palpable mass, retraction, or nipple discharge Cardiovascular: Regular rate and rhythm Abdomen:  Soft, non tender, no hepatosplenomegaly Pelvic:  External genitalia is normal in appearance, has vitiligo.  The vagina is pale. Urethra has no lesions or masses. The cervix is smooth, pap with HR HPV genotyping performed.  Uterus is felt to be normal size, shape, and contour.  No adnexal masses or tenderness noted.Bladder is non tender, no masses felt. Rectal: Good sphincter tone, no polyps,+ hemorrhoids felt.  Hemoccult negative. Has redness from wiping so much Extremities/musculoskeletal:  No swelling or varicosities noted, no clubbing or  cyanosis Psych:  No mood changes, alert and cooperative,seems happy AA is 0 Fall risk is low    11/30/2023   10:14 AM 11/15/2022    8:36 AM 11/12/2021    8:35 AM  Depression screen PHQ 2/9  Decreased Interest 1 2 2   Down, Depressed, Hopeless 1 1 1   PHQ - 2 Score 2 3 3   Altered sleeping 2 2 3   Tired, decreased energy 1 1 1   Change in appetite 2 2 1   Feeling bad or failure about yourself  0 0 0  Trouble concentrating 1 0 1  Moving slowly or fidgety/restless 0 0 0  Suicidal thoughts 0 0 0  PHQ-9 Score 8 8 9        11/30/2023   10:15 AM 11/15/2022    8:37 AM 11/12/2021    8:35 AM 11/11/2020    1:35 PM  GAD 7 : Generalized Anxiety Score  Nervous, Anxious, on Edge 0 1 1 2   Control/stop worrying 0 1 1 2   Worry too much - different things 0 1 1 2   Trouble relaxing 0 1 1 2   Restless 0 1 1 2   Easily annoyed or irritable 0 0 1 2  Afraid - awful might happen 0 0 1 0  Total GAD 7 Score 0 5 7 12     Upstream - 11/30/23 1017       Pregnancy Intention Screening   Does the patient want to become pregnant in the next year? N/A    Does the patient's partner want to become pregnant in the next  year? N/A    Would the patient like to discuss contraceptive options today? N/A      Contraception Wrap Up   Current Method Female Sterilization   ablation, post menopausal   End Method Female Sterilization    Contraception Counseling Provided No              Examination chaperoned by Freddie Apley RN  Impression and Plan: 1. Encounter for routine gynecological examination with Papanicolaou smear of cervix (Primary) Pap sent Pap in 3 years if normal Physical in 1 year Labs done fasting today  - CBC - Comprehensive metabolic panel - Lipid panel Has colonoscopy in January Mammogram was negative 10/28/23 Get aquaphor 3N1 cream on around rectal area  2. Encounter for screening fecal occult blood testing Hemoccult was negative  - POCT occult blood stool  3. Vitiligo  4.  Hemorrhoids, unspecified hemorrhoid type   5. Reactive depression On lexapro 10 mg 1 daily, has refills Has ativan prn  6. Elevated cholesterol - Lipid panel

## 2023-12-01 LAB — CBC
Hematocrit: 41.8 % (ref 34.0–46.6)
Hemoglobin: 13.8 g/dL (ref 11.1–15.9)
MCH: 28.7 pg (ref 26.6–33.0)
MCHC: 33 g/dL (ref 31.5–35.7)
MCV: 87 fL (ref 79–97)
Platelets: 273 10*3/uL (ref 150–450)
RBC: 4.81 x10E6/uL (ref 3.77–5.28)
RDW: 13.2 % (ref 11.7–15.4)
WBC: 5.3 10*3/uL (ref 3.4–10.8)

## 2023-12-01 LAB — LIPID PANEL
Chol/HDL Ratio: 2.8 {ratio} (ref 0.0–4.4)
Cholesterol, Total: 199 mg/dL (ref 100–199)
HDL: 72 mg/dL (ref >39)
LDL Chol Calc (NIH): 115 mg/dL — ABNORMAL HIGH (ref 0–99)
Triglycerides: 65 mg/dL (ref 0–149)
VLDL Cholesterol Cal: 12 mg/dL (ref 5–40)

## 2023-12-01 LAB — COMPREHENSIVE METABOLIC PANEL
ALT: 13 [IU]/L (ref 0–32)
AST: 22 [IU]/L (ref 0–40)
Albumin: 4.3 g/dL (ref 3.8–4.9)
Alkaline Phosphatase: 85 [IU]/L (ref 44–121)
BUN/Creatinine Ratio: 24 (ref 12–28)
BUN: 15 mg/dL (ref 8–27)
Bilirubin Total: 0.5 mg/dL (ref 0.0–1.2)
CO2: 25 mmol/L (ref 20–29)
Calcium: 9.6 mg/dL (ref 8.7–10.3)
Chloride: 101 mmol/L (ref 96–106)
Creatinine, Ser: 0.62 mg/dL (ref 0.57–1.00)
Globulin, Total: 2.5 g/dL (ref 1.5–4.5)
Glucose: 92 mg/dL (ref 70–99)
Potassium: 3.8 mmol/L (ref 3.5–5.2)
Sodium: 142 mmol/L (ref 134–144)
Total Protein: 6.8 g/dL (ref 6.0–8.5)
eGFR: 102 mL/min/{1.73_m2} (ref >59)

## 2023-12-02 LAB — CYTOLOGY - PAP
Comment: NEGATIVE
Comment: NEGATIVE
Comment: NEGATIVE
Diagnosis: NEGATIVE
HPV 16: NEGATIVE
HPV 18 / 45: POSITIVE — AB
High risk HPV: POSITIVE — AB

## 2023-12-09 DIAGNOSIS — I10 Essential (primary) hypertension: Secondary | ICD-10-CM | POA: Diagnosis not present

## 2023-12-12 ENCOUNTER — Encounter: Payer: Self-pay | Admitting: Adult Health

## 2023-12-12 DIAGNOSIS — R87810 Cervical high risk human papillomavirus (HPV) DNA test positive: Secondary | ICD-10-CM | POA: Insufficient documentation

## 2023-12-13 ENCOUNTER — Telehealth: Payer: Self-pay

## 2023-12-13 NOTE — Telephone Encounter (Signed)
Left voicemail on patient's phone to call office to get colpo scheduled.

## 2023-12-27 ENCOUNTER — Ambulatory Visit: Payer: 59 | Admitting: Obstetrics & Gynecology

## 2023-12-27 ENCOUNTER — Encounter: Payer: Self-pay | Admitting: Obstetrics & Gynecology

## 2023-12-27 VITALS — BP 147/84 | HR 71 | Ht 62.0 in | Wt 218.0 lb

## 2023-12-27 DIAGNOSIS — R87810 Cervical high risk human papillomavirus (HPV) DNA test positive: Secondary | ICD-10-CM

## 2023-12-27 NOTE — Progress Notes (Signed)
    Colposcopy Procedure Note:    Colposcopy Procedure Note  Indications:  2024 +HPV 18 with normal cytology All previous Paps have been normal    2019 ASCCP recommendation:  Smoker:  No. New sexual partner:  No.    History of abnormal Pap: no  Procedure Details  The risks and benefits of the procedure and Written informed consent obtained.  Speculum placed in vagina and excellent visualization of cervix achieved, cervix swabbed x 3 with acetic acid solution.  Findings: Adequate colposcopy is noted today.  Cervix: no visible lesions, no mosaicism, no punctation, and no abnormal vasculature; SCJ visualized 360 degrees without lesions and no biopsies taken. Vaginal inspection: normal without visible lesions. Vulvar colposcopy: vulvar colposcopy not performed.  Specimens: none  Complications: none.  Colposcopic Impression: Normal colposcopy as expected with normal cytology  Plan(Based on 2019 ASCCP recommendations) Repeat the HPV based cytology in 1 year

## 2023-12-29 ENCOUNTER — Other Ambulatory Visit (HOSPITAL_COMMUNITY)
Admission: RE | Admit: 2023-12-29 | Discharge: 2023-12-29 | Disposition: A | Discharge status: Home / Self Care | Payer: 59 | Source: Ambulatory Visit | Attending: Internal Medicine | Admitting: Internal Medicine

## 2023-12-29 DIAGNOSIS — Z860100 Personal history of colon polyps, unspecified: Secondary | ICD-10-CM | POA: Insufficient documentation

## 2023-12-29 DIAGNOSIS — Z09 Encounter for follow-up examination after completed treatment for conditions other than malignant neoplasm: Secondary | ICD-10-CM | POA: Diagnosis not present

## 2023-12-29 LAB — BASIC METABOLIC PANEL
Anion gap: 8 (ref 5–15)
BUN: 21 mg/dL — ABNORMAL HIGH (ref 6–20)
CO2: 27 mmol/L (ref 22–32)
Calcium: 9.3 mg/dL (ref 8.9–10.3)
Chloride: 102 mmol/L (ref 98–111)
Creatinine, Ser: 0.64 mg/dL (ref 0.44–1.00)
GFR, Estimated: >60 mL/min (ref >60)
Glucose, Bld: 103 mg/dL — ABNORMAL HIGH (ref 70–99)
Potassium: 3.5 mmol/L (ref 3.5–5.1)
Sodium: 137 mmol/L (ref 135–145)

## 2024-01-06 ENCOUNTER — Ambulatory Visit (HOSPITAL_COMMUNITY): Payer: 59 | Admitting: Anesthesiology

## 2024-01-06 ENCOUNTER — Ambulatory Visit (HOSPITAL_BASED_OUTPATIENT_CLINIC_OR_DEPARTMENT_OTHER): Payer: 59 | Admitting: Anesthesiology

## 2024-01-06 ENCOUNTER — Encounter (HOSPITAL_COMMUNITY): Payer: Self-pay | Admitting: Internal Medicine

## 2024-01-06 ENCOUNTER — Other Ambulatory Visit: Payer: Self-pay

## 2024-01-06 ENCOUNTER — Encounter (HOSPITAL_COMMUNITY)
Admission: RE | Disposition: A | Discharge status: Home / Self Care | Payer: Self-pay | Source: Ambulatory Visit | Attending: Internal Medicine

## 2024-01-06 ENCOUNTER — Ambulatory Visit (HOSPITAL_COMMUNITY)
Admission: RE | Admit: 2024-01-06 | Discharge: 2024-01-06 | Disposition: A | Discharge status: Home / Self Care | Payer: 59 | Source: Ambulatory Visit | Attending: Internal Medicine | Admitting: Internal Medicine

## 2024-01-06 DIAGNOSIS — K644 Residual hemorrhoidal skin tags: Secondary | ICD-10-CM | POA: Diagnosis not present

## 2024-01-06 DIAGNOSIS — D120 Benign neoplasm of cecum: Secondary | ICD-10-CM

## 2024-01-06 DIAGNOSIS — Z1211 Encounter for screening for malignant neoplasm of colon: Secondary | ICD-10-CM | POA: Diagnosis not present

## 2024-01-06 DIAGNOSIS — K635 Polyp of colon: Secondary | ICD-10-CM | POA: Diagnosis not present

## 2024-01-06 DIAGNOSIS — K648 Other hemorrhoids: Secondary | ICD-10-CM

## 2024-01-06 DIAGNOSIS — I10 Essential (primary) hypertension: Secondary | ICD-10-CM | POA: Insufficient documentation

## 2024-01-06 DIAGNOSIS — G4730 Sleep apnea, unspecified: Secondary | ICD-10-CM | POA: Insufficient documentation

## 2024-01-06 DIAGNOSIS — Z860101 Personal history of adenomatous and serrated colon polyps: Secondary | ICD-10-CM | POA: Diagnosis not present

## 2024-01-06 DIAGNOSIS — Z87891 Personal history of nicotine dependence: Secondary | ICD-10-CM | POA: Diagnosis not present

## 2024-01-06 DIAGNOSIS — K6389 Other specified diseases of intestine: Secondary | ICD-10-CM | POA: Diagnosis not present

## 2024-01-06 HISTORY — PX: COLONOSCOPY WITH PROPOFOL: SHX5780

## 2024-01-06 HISTORY — DX: Anxiety disorder, unspecified: F41.9

## 2024-01-06 HISTORY — PX: POLYPECTOMY: SHX5525

## 2024-01-06 SURGERY — COLONOSCOPY WITH PROPOFOL
Anesthesia: General

## 2024-01-06 MED ORDER — PROPOFOL 10 MG/ML IV BOLUS
INTRAVENOUS | Status: DC | PRN
  Administered 2024-01-06: 100 mg via INTRAVENOUS

## 2024-01-06 MED ORDER — PROPOFOL 500 MG/50ML IV EMUL
INTRAVENOUS | Status: DC | PRN
  Administered 2024-01-06: 100 ug/kg/min via INTRAVENOUS

## 2024-01-06 MED ORDER — LACTATED RINGERS IV SOLN: INTRAVENOUS | Status: DC

## 2024-01-06 NOTE — H&P (Signed)
Primary Care Physician:  Elfredia Nevins, MD Primary Gastroenterologist:  Dr. Marletta Lor  Pre-Procedure History & Physical: HPI:  Emma Pittman is a 63 y.o. female is here for a colonoscopy to be performed for surveillance purposes, personal history of adenomatous colon polyps in 2019  Past Medical History:  Diagnosis Date   Anxiety    Hemorrhoids 03/22/2016   Hypercholesteremia    Hypertension    PONV (postoperative nausea and vomiting)    Spotting 03/06/2014   Vertigo    Vitiligo     Past Surgical History:  Procedure Laterality Date   APPENDECTOMY  age 49   CATARACT EXTRACTION W/PHACO  08/19/2011   Procedure: CATARACT EXTRACTION PHACO AND INTRAOCULAR LENS PLACEMENT (IOC);  Surgeon: Gemma Payor;  Location: [facility];  Service: Ophthalmology;  Laterality: Right;  CDE:9.50   CATARACT EXTRACTION W/PHACO  09/06/2011   Procedure: CATARACT EXTRACTION PHACO AND INTRAOCULAR LENS PLACEMENT (IOC);  Surgeon: Gemma Payor;  Location: [facility];  Service: Ophthalmology;  Laterality: Left;  CDE: 5.25   COLONOSCOPY     COLONOSCOPY N/A 09/08/2018   Procedure: COLONOSCOPY;  Surgeon: West Bali, MD;  Location: [facility];  Service: Endoscopy;  Laterality: N/A;  1:00   ENDOMETRIAL ABLATION  8 yrs ago-eure   aph   POLYPECTOMY  09/08/2018   Procedure: POLYPECTOMY;  Surgeon: West Bali, MD;  Location: [facility];  Service: Endoscopy;;  colon   TUBAL LIGATION  15 yrs & 10 yrs    aph-Dr Ferguson:also had dye studies of tubes with opening of one tube    Prior to Admission medications   Medication Sig Start Date End Date Taking? Authorizing Provider  acetaminophen (TYLENOL) 500 MG tablet Take 1,000 mg by mouth every 6 (six) hours as needed for moderate pain or headache.   Yes [provider]  atorvastatin (LIPITOR) 10 MG tablet Take 10 mg by mouth every morning.    Yes [provider]  escitalopram (LEXAPRO) 10 MG tablet Take 1 tablet by mouth once daily Patient taking differently:  Take 20 tablets by mouth daily. 03/02/21  Yes Adline Potter, NP  lisinopril-hydrochlorothiazide (PRINZIDE,ZESTORETIC) 10-12.5 MG per tablet Take 1 tablet by mouth every morning.    Yes [provider]  LORazepam (ATIVAN) 0.5 MG tablet TAKE 1 TABLET BY MOUTH EVERY 8 HOURS AS NEEDED FOR ANXIETY 11/16/21  Yes Adline Potter, NP  meclizine (ANTIVERT) 25 MG tablet Take 25 mg by mouth 3 (three) times daily as needed for dizziness.   Yes [provider]    Allergies as of 11/23/2023   (No Known Allergies)    Family History  Problem Relation Age of Onset   Heart attack Mother    Cancer Mother        colon   Kidney failure Father    Congestive Heart Failure Father    Congestive Heart Failure Brother        colon polyps   Anesthesia problems Neg Hx    Hypotension Neg Hx    Malignant hyperthermia Neg Hx    Pseudochol deficiency Neg Hx     Social History   Socioeconomic History   Marital status: Widowed    Spouse name: Not on file   Number of children: 1   Years of education: Not on file   Highest education level: Not on file  Occupational History   Not on file  Tobacco Use   Smoking status: Former    Current packs/day: 0.00  Average packs/day: 0.5 packs/day for 4.0 years (2.0 ttl pk-yrs)    Types: Cigarettes    Start date: 08/12/1987    Quit date: 08/12/1991    Years since quitting: 32.4   Smokeless tobacco: Never  Vaping Use   Vaping status: Never Used  Substance and Sexual Activity   Alcohol use: Not Currently    Comment: occassional   Drug use: No   Sexual activity: Not Currently    Birth control/protection: Surgical, Post-menopausal    Comment: tubal and ablation  Other Topics Concern   Not on file  Social History Narrative   Not on file   Social Drivers of Health   Financial Resource Strain: Low Risk  (11/30/2023)   Overall Financial Resource Strain (CARDIA)    Difficulty of Paying Living Expenses: Not very hard  Food Insecurity: No  Food Insecurity (11/30/2023)   Hunger Vital Sign    Worried About Running Out of Food in the Last Year: Never true    Ran Out of Food in the Last Year: Never true  Transportation Needs: No Transportation Needs (11/30/2023)   PRAPARE - Administrator, Civil Service (Medical): No    Lack of Transportation (Non-Medical): No  Physical Activity: Insufficiently Active (11/30/2023)   Exercise Vital Sign    Days of Exercise per Week: 3 days    Minutes of Exercise per Session: 20 min  Stress: No Stress Concern Present (11/30/2023)   [employer] of Occupational Health - Occupational Stress Questionnaire    Feeling of Stress : Only a little  Social Connections: Unknown (11/30/2023)   Social Connection and Isolation Panel [NHANES]    Frequency of Communication with Friends and Family: More than three times a week    Frequency of Social Gatherings with Friends and Family: More than three times a week    Attends Religious Services: Patient declined    Database administrator or Organizations: No    Attends Banker Meetings: Never    Marital Status: Widowed  Intimate Partner Violence: Not At Risk (11/30/2023)   Humiliation, Afraid, Rape, and Kick questionnaire    Fear of Current or Ex-Partner: No    Emotionally Abused: No    Physically Abused: No    Sexually Abused: No    Review of Systems: See HPI, otherwise negative ROS  Physical Exam: Vital signs in last 24 hours: Temp:  [98.4 F (36.9 C)] 98.4 F (36.9 C) (01/24 0645) Pulse Rate:  [75] 75 (01/24 0645) Resp:  [9] 9 (01/24 0645) BP: (169)/(87) 169/87 (01/24 0645) SpO2:  [97 %] 97 % (01/24 0645) Weight:  [98.4 kg] 98.4 kg (01/24 0637)   General:   Alert,  Well-developed, well-nourished, pleasant and cooperative in NAD Head:  Normocephalic and atraumatic. Eyes:  Sclera clear, no icterus.   Conjunctiva pink. Ears:  Normal auditory acuity. Nose:  No deformity, discharge,  or lesions. Msk:  Symmetrical  without gross deformities. Normal posture. Extremities:  Without clubbing or edema. Neurologic:  Alert and  oriented x4;  grossly normal neurologically. Skin:  Intact without significant lesions or rashes. Psych:  Alert and cooperative. Normal mood and affect.  Impression/Plan: Emma Pittman is here for a colonoscopy to be performed for surveillance purposes, personal history of adenomatous colon polyps in 2019  The risks of the procedure including infection, bleed, or perforation as well as benefits, limitations, alternatives and imponderables have been reviewed with the patient. Questions have been answered. All parties agreeable.

## 2024-01-06 NOTE — Transfer of Care (Signed)
Immediate Anesthesia Transfer of Care Note  Patient: Emma Pittman  Procedure(s) Performed: COLONOSCOPY WITH PROPOFOL POLYPECTOMY  Patient Location: Endoscopy Unit  Anesthesia Type:General  Level of Consciousness: awake and patient cooperative  Airway & Oxygen Therapy: Patient Spontanous Breathing  Post-op Assessment: Report given to RN and Post -op Vital signs reviewed and stable  Post vital signs: Reviewed and stable  Last Vitals:  Vitals Value Taken Time  BP 124/63 01/06/24 0810  Temp 36.6 C 01/06/24 0810  Pulse 66 01/06/24 0810  Resp 15 01/06/24 0810  SpO2 98 % 01/06/24 0810    Last Pain:  Vitals:   01/06/24 0810  TempSrc: Oral  PainSc: 0-No pain      Patients Stated Pain Goal: 8 (01/06/24 9604)  Complications: No notable events documented.

## 2024-01-06 NOTE — Op Note (Signed)
[facility] Patient Name: Emma Pittman Procedure Date: 01/06/2024 7:11 AM MRN: [id number] Date of Birth: 12/29/1960 Attending MD: Hennie Duos. Marletta Lor , [place], [id number] CSN: [id number] Age: 63 Admit Type: Outpatient Procedure:                Colonoscopy Indications:              Surveillance: Personal history of adenomatous                            polyps on last colonoscopy > 5 years ago Providers:                Hennie Duos. Marletta Lor, DO, Jannett Celestine, RN, Kristine L.                            Jessee Avers, Technician Referring MD:              Medicines:                See the Anesthesia note for documentation of the                            administered medications Complications:            No immediate complications. Estimated Blood Loss:     Estimated blood loss was minimal. Procedure:                Pre-Anesthesia Assessment:                           - The anesthesia plan was to use monitored                            anesthesia care (MAC).                           After obtaining informed consent, the colonoscope                            was passed under direct vision. Throughout the                            procedure, the patient's blood pressure, pulse, and                            oxygen saturations were monitored continuously. The                            [id number]-HQ190L ([id number]) scope was introduced through                            the anus and advanced to the the cecum, identified                            by appendiceal orifice and ileocecal valve. The                            colonoscopy  was performed without difficulty. The                            patient tolerated the procedure well. The quality                            of the bowel preparation was evaluated using the                            BBPS [facility] Bowel Preparation Scale) with scores                            of: Right Colon = 3, Transverse Colon = 3 and Left                             Colon = 3 (entire mucosa seen well with no residual                            staining, small fragments of stool or opaque                            liquid). The total BBPS score equals 9. Scope In: 7:54:20 AM Scope Out: 8:04:21 AM Scope Withdrawal Time: 0 hours 8 minutes 8 seconds  Total Procedure Duration: 0 hours 10 minutes 1 second  Findings:      Hemorrhoids were found on perianal exam.      Non-bleeding internal hemorrhoids were found.      A 4 mm polyp was found in the cecum. The polyp was sessile. The polyp       was removed with a cold snare. Resection and retrieval were complete.      The exam was otherwise without abnormality. Impression:               - Hemorrhoids found on perianal exam.                           - Non-bleeding internal hemorrhoids.                           - One 4 mm polyp in the cecum, removed with a cold                            snare. Resected and retrieved.                           - The examination was otherwise normal. Moderate Sedation:      Per Anesthesia Care Recommendation:           - Patient has a contact number available for                            emergencies. The signs and symptoms of potential                            delayed complications were discussed with the  patient. Return to normal activities tomorrow.                            Written discharge instructions were provided to the                            patient.                           - Resume previous diet.                           - Continue present medications.                           - Await pathology results.                           - Repeat colonoscopy in 5 years for surveillance                            and family history of colon cancer in mother                           - Return to GI clinic PRN. Procedure Code(s):        --- Professional ---                           [id number], Colonoscopy, flexible; with removal of                             tumor(s), polyp(s), or other lesion(s) by snare                            technique Diagnosis Code(s):        --- Professional ---                           Z86.010, Personal history of colonic polyps                           D12.0, Benign neoplasm of cecum                           K64.8, Other hemorrhoids CPT copyright 2022 American Medical Association. All rights reserved. The codes documented in this report are preliminary and upon coder review may  be revised to meet current compliance requirements. Hennie Duos. Marletta Lor, DO Hennie Duos. Marletta Lor, DO 01/06/2024 8:14:16 AM This report has been signed electronically. Number of Addenda: 0

## 2024-01-06 NOTE — Anesthesia Procedure Notes (Signed)
Date/Time: 01/06/2024 7:42 AM  Performed by: Franco Nones, CRNAPre-anesthesia Checklist: Patient identified, Emergency Drugs available, Suction available, Timeout performed and Patient being monitored Patient Re-evaluated:Patient Re-evaluated prior to induction Oxygen Delivery Method: Nasal Cannula

## 2024-01-06 NOTE — Anesthesia Postprocedure Evaluation (Signed)
Anesthesia Post Note  Patient: Emma Pittman  Procedure(s) Performed: COLONOSCOPY WITH PROPOFOL POLYPECTOMY  Patient location during evaluation: Phase II Anesthesia Type: General Level of consciousness: awake Pain management: pain level controlled Vital Signs Assessment: post-procedure vital signs reviewed and stable Respiratory status: spontaneous breathing and respiratory function stable Cardiovascular status: blood pressure returned to baseline and stable Postop Assessment: no headache and no apparent nausea or vomiting Anesthetic complications: no Comments: Late entry   No notable events documented.   Last Vitals:  Vitals:   01/06/24 0645 01/06/24 0810  BP: (!) 169/87 124/63  Pulse: 75 66  Resp: (!) 9 15  Temp: 36.9 C 36.6 C  SpO2: 97% 98%    Last Pain:  Vitals:   01/06/24 0810  TempSrc: Oral  PainSc: 0-No pain                 Windell Norfolk

## 2024-01-06 NOTE — Discharge Instructions (Addendum)
  Colonoscopy Discharge Instructions  Read the instructions outlined below and refer to this sheet in the next few weeks. These discharge instructions provide you with general information on caring for yourself after you leave the hospital. Your doctor may also give you specific instructions. While your treatment has been planned according to the most current medical practices available, unavoidable complications occasionally occur.   ACTIVITY You may resume your regular activity, but move at a slower pace for the next 24 hours.  Take frequent rest periods for the next 24 hours.  Walking will help get rid of the air and reduce the bloated feeling in your belly (abdomen).  No driving for 24 hours (because of the medicine (anesthesia) used during the test).   Do not sign any important legal documents or operate any machinery for 24 hours (because of the anesthesia used during the test).  NUTRITION Drink plenty of fluids.  You may resume your normal diet as instructed by your doctor.  Begin with a light meal and progress to your normal diet. Heavy or fried foods are harder to digest and may make you feel sick to your stomach (nauseated).  Avoid alcoholic beverages for 24 hours or as instructed.  MEDICATIONS You may resume your normal medications unless your doctor tells you otherwise.  WHAT YOU CAN EXPECT TODAY Some feelings of bloating in the abdomen.  Passage of more gas than usual.  Spotting of blood in your stool or on the toilet paper.  IF YOU HAD POLYPS REMOVED DURING THE COLONOSCOPY: No aspirin products for 7 days or as instructed.  No alcohol for 7 days or as instructed.  Eat a soft diet for the next 24 hours.  FINDING OUT THE RESULTS OF YOUR TEST Not all test results are available during your visit. If your test results are not back during the visit, make an appointment with your caregiver to find out the results. Do not assume everything is normal if you have not heard from your  caregiver or the medical facility. It is important for you to follow up on all of your test results.  SEEK IMMEDIATE MEDICAL ATTENTION IF: You have more than a spotting of blood in your stool.  Your belly is swollen (abdominal distention).  You are nauseated or vomiting.  You have a temperature over 101.  You have abdominal pain or discomfort that is severe or gets worse throughout the day.   Your colonoscopy revealed 1 polyp(s) which I removed successfully. Await pathology results, my office will contact you. I recommend repeating colonoscopy in 5 years for surveillance purposes.   Otherwise follow up with GI as needed.  I hope you have a great rest of your week!  Hennie Duos. Marletta Lor, D.O. Gastroenterology and Hepatology [facility]

## 2024-01-06 NOTE — Anesthesia Preprocedure Evaluation (Signed)
Anesthesia Evaluation  Patient identified by MRN, date of birth, ID band Patient awake    Reviewed: Allergy & Precautions, H&P , NPO status , Patient's Chart, lab work & pertinent test results, reviewed documented beta blocker date and time   History of Anesthesia Complications (+) PONV and history of anesthetic complications  Airway Mallampati: II  TM Distance: >3 FB Neck ROM: full    Dental no notable dental hx.    Pulmonary neg pulmonary ROS, sleep apnea , former smoker   Pulmonary exam normal breath sounds clear to auscultation       Cardiovascular Exercise Tolerance: Good hypertension, + DOE  negative cardio ROS + Valvular Problems/Murmurs  Rhythm:regular Rate:Normal     Neuro/Psych  PSYCHIATRIC DISORDERS Anxiety Depression    negative neurological ROS  negative psych ROS   GI/Hepatic negative GI ROS, Neg liver ROS,,,  Endo/Other  negative endocrine ROS    Renal/GU negative Renal ROS  negative genitourinary   Musculoskeletal   Abdominal   Peds  Hematology negative hematology ROS (+)   Anesthesia Other Findings   Reproductive/Obstetrics negative OB ROS                             Anesthesia Physical Anesthesia Plan  ASA: 2  Anesthesia Plan: General   Post-op Pain Management:    Induction:   PONV Risk Score and Plan: Propofol infusion  Airway Management Planned:   Additional Equipment:   Intra-op Plan:   Post-operative Plan:   Informed Consent: I have reviewed the patients History and Physical, chart, labs and discussed the procedure including the risks, benefits and alternatives for the proposed anesthesia with the patient or authorized representative who has indicated his/her understanding and acceptance.     Dental Advisory Given  Plan Discussed with: CRNA  Anesthesia Plan Comments:        Anesthesia Quick Evaluation

## 2024-01-09 ENCOUNTER — Encounter (HOSPITAL_COMMUNITY): Payer: Self-pay | Admitting: Internal Medicine

## 2024-01-09 LAB — SURGICAL PATHOLOGY

## 2024-02-10 ENCOUNTER — Other Ambulatory Visit: Payer: Self-pay

## 2024-02-10 ENCOUNTER — Emergency Department (HOSPITAL_COMMUNITY): Payer: 59

## 2024-02-10 ENCOUNTER — Encounter (HOSPITAL_COMMUNITY): Payer: Self-pay | Admitting: Emergency Medicine

## 2024-02-10 ENCOUNTER — Emergency Department (HOSPITAL_COMMUNITY)
Admission: EM | Admit: 2024-02-10 | Discharge: 2024-02-10 | Disposition: A | Discharge status: Home / Self Care | Payer: 59 | Attending: Emergency Medicine | Admitting: Emergency Medicine

## 2024-02-10 DIAGNOSIS — R918 Other nonspecific abnormal finding of lung field: Secondary | ICD-10-CM | POA: Diagnosis not present

## 2024-02-10 DIAGNOSIS — I10 Essential (primary) hypertension: Secondary | ICD-10-CM | POA: Insufficient documentation

## 2024-02-10 DIAGNOSIS — R6889 Other general symptoms and signs: Secondary | ICD-10-CM | POA: Diagnosis not present

## 2024-02-10 DIAGNOSIS — R231 Pallor: Secondary | ICD-10-CM | POA: Diagnosis not present

## 2024-02-10 DIAGNOSIS — J101 Influenza due to other identified influenza virus with other respiratory manifestations: Secondary | ICD-10-CM

## 2024-02-10 DIAGNOSIS — R0689 Other abnormalities of breathing: Secondary | ICD-10-CM | POA: Diagnosis not present

## 2024-02-10 DIAGNOSIS — Z79899 Other long term (current) drug therapy: Secondary | ICD-10-CM | POA: Diagnosis not present

## 2024-02-10 DIAGNOSIS — Z743 Need for continuous supervision: Secondary | ICD-10-CM | POA: Diagnosis not present

## 2024-02-10 DIAGNOSIS — R0789 Other chest pain: Secondary | ICD-10-CM | POA: Diagnosis not present

## 2024-02-10 DIAGNOSIS — R079 Chest pain, unspecified: Secondary | ICD-10-CM | POA: Diagnosis not present

## 2024-02-10 LAB — RESP PANEL BY RT-PCR (RSV, FLU A&B, COVID)  RVPGX2
Influenza A by PCR: POSITIVE — AB
Influenza B by PCR: NEGATIVE
Resp Syncytial Virus by PCR: NEGATIVE
SARS Coronavirus 2 by RT PCR: NEGATIVE

## 2024-02-10 LAB — BASIC METABOLIC PANEL WITH GFR
Anion gap: 12 (ref 5–15)
BUN: 11 mg/dL (ref 6–20)
CO2: 22 mmol/L (ref 22–32)
Calcium: 9.1 mg/dL (ref 8.9–10.3)
Chloride: 99 mmol/L (ref 98–111)
Creatinine, Ser: 0.66 mg/dL (ref 0.44–1.00)
GFR, Estimated: >60 mL/min
Glucose, Bld: 102 mg/dL — ABNORMAL HIGH (ref 70–99)
Potassium: 3.3 mmol/L — ABNORMAL LOW (ref 3.5–5.1)
Sodium: 133 mmol/L — ABNORMAL LOW (ref 135–145)

## 2024-02-10 LAB — CBC
HCT: 38.6 % (ref 36.0–46.0)
Hemoglobin: 12.9 g/dL (ref 12.0–15.0)
MCH: 28.2 pg (ref 26.0–34.0)
MCHC: 33.4 g/dL (ref 30.0–36.0)
MCV: 84.3 fL (ref 80.0–100.0)
Platelets: 224 10*3/uL (ref 150–400)
RBC: 4.58 MIL/uL (ref 3.87–5.11)
RDW: 12.8 % (ref 11.5–15.5)
WBC: 6.7 10*3/uL (ref 4.0–10.5)
nRBC: 0 % (ref 0.0–0.2)

## 2024-02-10 LAB — TROPONIN I (HIGH SENSITIVITY)
Troponin I (High Sensitivity): <2 ng/L (ref <18)
Troponin I (High Sensitivity): 4 ng/L (ref <18)

## 2024-02-10 MED ORDER — MECLIZINE HCL 12.5 MG PO TABS
25.0000 mg | ORAL_TABLET | Freq: Once | ORAL | Status: AC
  Administered 2024-02-10: 25 mg via ORAL
  Filled 2024-02-10: qty 2

## 2024-02-10 MED ORDER — OSELTAMIVIR PHOSPHATE 75 MG PO CAPS: 75.0000 mg | ORAL_CAPSULE | Freq: Two times a day (BID) | ORAL | 0 refills | Status: DC

## 2024-02-10 MED ORDER — ONDANSETRON 4 MG PO TBDP
4.0000 mg | ORAL_TABLET | Freq: Once | ORAL | Status: AC
  Administered 2024-02-10: 4 mg via ORAL
  Filled 2024-02-10: qty 1

## 2024-02-10 MED ORDER — ACETAMINOPHEN 325 MG PO TABS
650.0000 mg | ORAL_TABLET | Freq: Once | ORAL | Status: AC
  Administered 2024-02-10: 650 mg via ORAL
  Filled 2024-02-10: qty 2

## 2024-02-10 MED ORDER — AZITHROMYCIN 250 MG PO TABS: 250.0000 mg | ORAL_TABLET | Freq: Every day | ORAL | 0 refills | Status: DC

## 2024-02-10 NOTE — ED Notes (Signed)
 Pt ambulated well. O2 maintained 98% through ambulation.

## 2024-02-10 NOTE — Discharge Instructions (Addendum)
 Rest make sure you are drinking plenty of fluids.  Continue taking Tylenol or ibuprofen for headache and fever reduction.  You are being prescribed Tamiflu which we discussed.  This may or may not help you feel better sooner.  It also has the potential to cause stomach cramping and diarrhea, if you develop the symptoms I recommend discontinuing this medication.  Get rechecked for any worsening symptoms.  Expect the flu to run its course over approximately 7 days.  You are also being placed on a course of antibiotics as your chest x-ray suggests a small focus of possible infection in your left lower lobe.  This is probably not a pneumonia but we are going to treat this with antibiotics to prevent any potential worsening.

## 2024-02-10 NOTE — ED Provider Notes (Signed)
 [place] EMERGENCY DEPARTMENT AT [facility] Provider Note   CSN: [id number] Arrival date & time: 02/10/24  1154     History  Chief Complaint  Patient presents with   Chest Pain    Emma Pittman is a 63 y.o. female with a history include hypertension, hyperlipidemia, sleep apnea, presenting for evaluation of lightheadedness along with chest pressure radiating up into her neck along with nausea and vomiting x 1 today.  Symptoms started as she was getting out of her shower prior to arrival.  She has a history of vertigo and reports her dizziness symptoms are similar to vertigo, describing a room spinning quality, currently better but not resolved after taking meclizine.  She denies shortness of breath.  Her chest pain is described as pressure sensation.  She does endorse having a nonproductive cough, subjective fever since yesterday.  The history is provided by the patient.       Home Medications Prior to Admission medications   Medication Sig Start Date End Date Taking? Authorizing Provider  acetaminophen (TYLENOL) 500 MG tablet Take 1,000 mg by mouth every 6 (six) hours as needed for moderate pain or headache.   Yes [provider]  atorvastatin (LIPITOR) 10 MG tablet Take 10 mg by mouth every morning.    Yes [provider]  azithromycin (ZITHROMAX) 250 MG tablet Take 1 tablet (250 mg total) by mouth daily. Take first 2 tablets together, then 1 every day until finished. 02/10/24  Yes Dorn Hartshorne, Raynelle Fanning, PA-C  escitalopram (LEXAPRO) 20 MG tablet Take 20 mg by mouth daily. 01/30/24  Yes [provider]  lisinopril-hydrochlorothiazide (PRINZIDE,ZESTORETIC) 10-12.5 MG per tablet Take 1 tablet by mouth every morning.    Yes [provider]  LORazepam (ATIVAN) 0.5 MG tablet TAKE 1 TABLET BY MOUTH EVERY 8 HOURS AS NEEDED FOR ANXIETY 11/16/21  Yes Adline Potter, NP  meclizine (ANTIVERT) 25 MG tablet Take 25 mg by mouth 3 (three) times daily as  needed for dizziness.   Yes [provider]  oseltamivir (TAMIFLU) 75 MG capsule Take 1 capsule (75 mg total) by mouth every 12 (twelve) hours. 02/10/24  Yes IdolFidela Juneau  VENTOLIN HFA 108 (90 Base) MCG/ACT inhaler SMARTSIG:2 Puff(s) By Mouth Every 4 Hours 12/21/23  Yes [provider]  benzonatate (TESSALON) 100 MG capsule Take 100 mg by mouth 3 (three) times daily as needed. 01/13/24   [provider]      Allergies    Patient has no known allergies.    Review of Systems   Review of Systems  Constitutional:  Positive for fever.  HENT:  Negative for congestion and sore throat.   Eyes: Negative.   Respiratory:  Positive for cough and chest tightness. Negative for shortness of breath.   Cardiovascular:  Negative for chest pain.  Gastrointestinal:  Negative for abdominal pain and nausea.  Genitourinary: Negative.   Musculoskeletal:  Positive for myalgias. Negative for arthralgias, joint swelling and neck pain.  Skin: Negative.  Negative for rash and wound.  Neurological:  Positive for headaches. Negative for dizziness, weakness, light-headedness and numbness.  Psychiatric/Behavioral: Negative.      Physical Exam Updated Vital Signs BP 121/68 (BP Location: Right Arm)   Pulse 77   Temp 99.8 F (37.7 C) (Oral)   Resp 11   Ht 5\' 2"  (1.575 m)   Wt 98.4 kg   SpO2 97%   BMI 39.68 kg/m  Physical Exam Vitals and nursing note reviewed.  Constitutional:  Appearance: She is well-developed.  HENT:     Head: Normocephalic and atraumatic.  Eyes:     Conjunctiva/sclera: Conjunctivae normal.  Cardiovascular:     Rate and Rhythm: Normal rate and regular rhythm.     Heart sounds: Normal heart sounds.  Pulmonary:     Effort: Pulmonary effort is normal.     Breath sounds: Normal breath sounds. No wheezing or rhonchi.  Abdominal:     General: Bowel sounds are normal.     Palpations: Abdomen is soft.     Tenderness: There is no abdominal tenderness.   Musculoskeletal:        General: Normal range of motion.     Cervical back: Normal range of motion.  Skin:    General: Skin is warm and dry.  Neurological:     General: No focal deficit present.     Mental Status: She is alert.     Cranial Nerves: Cranial nerves 2-12 are intact.     Sensory: Sensation is intact.     Motor: Motor function is intact. No pronator drift.     Comments: Normal grip strength     ED Results / Procedures / Treatments   Labs (all labs ordered are listed, but only abnormal results are displayed) Labs Reviewed  RESP PANEL BY RT-PCR (RSV, FLU A&B, COVID)  RVPGX2 - Abnormal; Notable for the following components:      Result Value   Influenza A by PCR POSITIVE (*)    All other components within normal limits  BASIC METABOLIC PANEL - Abnormal; Notable for the following components:   Sodium 133 (*)    Potassium 3.3 (*)    Glucose, Bld 102 (*)    All other components within normal limits  CBC  TROPONIN I (HIGH SENSITIVITY)  TROPONIN I (HIGH SENSITIVITY)    EKG EKG Interpretation Date/Time:  Friday February 10 2024 12:39:27 EST Ventricular Rate:  83 PR Interval:  156 QRS Duration:  101 QT Interval:  418 QTC Calculation: 492 R Axis:   22  Text Interpretation: Sinus rhythm Low voltage, precordial leads Abnormal inferior Q waves Borderline T abnormalities, anterior leads Borderline prolonged QT interval Confirmed by Linwood Dibbles [phone]) on 02/11/2024 2:38:34 PM  Radiology No results found.  Results for orders placed or performed during the hospital encounter of 02/10/24  Basic metabolic panel   Collection Time: 02/10/24 12:32 PM  Result Value Ref Range   Sodium 133 (L) 135 - 145 mmol/L   Potassium 3.3 (L) 3.5 - 5.1 mmol/L   Chloride 99 98 - 111 mmol/L   CO2 22 22 - 32 mmol/L   Glucose, Bld 102 (H) 70 - 99 mg/dL   BUN 11 6 - 20 mg/dL   Creatinine, Ser 1.91 0.44 - 1.00 mg/dL   Calcium 9.1 8.9 - 47.8 mg/dL   GFR, Estimated >29 >56 mL/min   Anion gap  12 5 - 15  CBC   Collection Time: 02/10/24 12:32 PM  Result Value Ref Range   WBC 6.7 4.0 - 10.5 K/uL   RBC 4.58 3.87 - 5.11 MIL/uL   Hemoglobin 12.9 12.0 - 15.0 g/dL   HCT 21.3 08.6 - 57.8 %   MCV 84.3 80.0 - 100.0 fL   MCH 28.2 26.0 - 34.0 pg   MCHC 33.4 30.0 - 36.0 g/dL   RDW 46.9 62.9 - 52.8 %   Platelets 224 150 - 400 K/uL   nRBC 0.0 0.0 - 0.2 %  Troponin I (High Sensitivity)  Collection Time: 02/10/24 12:32 PM  Result Value Ref Range   Troponin I (High Sensitivity) <2 <18 ng/L  Troponin I (High Sensitivity)   Collection Time: 02/10/24  2:39 PM  Result Value Ref Range   Troponin I (High Sensitivity) 4 <18 ng/L  Resp panel by RT-PCR (RSV, Flu A&B, Covid) Anterior Nasal Swab   Collection Time: 02/10/24  2:44 PM   Specimen: Anterior Nasal Swab  Result Value Ref Range   SARS Coronavirus 2 by RT PCR NEGATIVE NEGATIVE   Influenza A by PCR POSITIVE (A) NEGATIVE   Influenza B by PCR NEGATIVE NEGATIVE   Resp Syncytial Virus by PCR NEGATIVE NEGATIVE   DG Chest 2 View Result Date: 02/10/2024 CLINICAL DATA:  Chest pain EXAM: CHEST - 2 VIEW COMPARISON:  Chest radiograph dated 01/19/2023 FINDINGS: Normal lung volumes. Small focus of patchy lateral left basilar opacity. No pleural effusion or pneumothorax. The heart size and mediastinal contours are within normal limits. No acute osseous abnormality. IMPRESSION: Small focus of patchy lateral left basilar opacity, likely atelectasis. Electronically Signed   By: Agustin Cree M.D.   On: 02/10/2024 14:09    Procedures Procedures    Medications Ordered in ED Medications  meclizine (ANTIVERT) tablet 25 mg (25 mg Oral Given 02/10/24 1439)  ondansetron (ZOFRAN-ODT) disintegrating tablet 4 mg (4 mg Oral Given 02/10/24 1441)  acetaminophen (TYLENOL) tablet 650 mg (650 mg Oral Given 02/10/24 1719)    ED Course/ Medical Decision Making/ A&P                                 Medical Decision Making Patient presenting with multiple symptoms  including what sounds like her typical vertigo improving after meclizine, also with chest pressure, nausea, fatigue, she has a low-grade fever here as well.  She has had cough and just generally not feeling well.  Generalized bodyaches.  Symptoms suggesting viral process and she has tested positive for influenza A.  She was given Zofran for nausea, also had generalized headache which was improving after Tylenol.  She was able to tolerate p.o. intake here.  We discussed Tamiflu, patient was undecided whether she wanted to take this medication.  I have sent the prescription, she can decide if she wants to get this filled, she was advised she should start it within 24 hours for best chance of effectiveness.  Amount and/or Complexity of Data Reviewed Labs: ordered.    Details: CBC, be met in delta troponins reviewed and reassuring.  She is positive for influenza A. Radiology: ordered.    Details: Chest x-ray reviewed and I agree with interpretation, there is concern for possible left lower basilar opacity, could reflect atelectasis but could also be early pneumonia.  Given her febrile symptoms will cover her for possible bacterial infection overlying her influenza.  Risk OTC drugs. Prescription drug management.           Final Clinical Impression(s) / ED Diagnoses Final diagnoses:  Influenza A    Rx / DC Orders ED Discharge Orders          Ordered    oseltamivir (TAMIFLU) 75 MG capsule  Every 12 hours        02/10/24 1733    azithromycin (ZITHROMAX) 250 MG tablet  Daily        02/10/24 1737              Burgess Amor, Cordelia Poche 02/12/24 2004  Pricilla Loveless, MD 02/14/24 [phone]

## 2024-02-10 NOTE — ED Triage Notes (Signed)
 Pt bib ems from home. Came out of shower and became dizzy with mid chest pressure up neck and n/v x 1. Pt arrived a/o. Color wnl. States pressure to chest/nausea and dizziness still present. Hx of vertigo.  Per ems, pt was clammy upon their arrived, received 324 asa and IV.

## 2024-02-17 DIAGNOSIS — J45909 Unspecified asthma, uncomplicated: Secondary | ICD-10-CM | POA: Diagnosis not present

## 2024-02-17 DIAGNOSIS — I10 Essential (primary) hypertension: Secondary | ICD-10-CM | POA: Diagnosis not present

## 2024-03-15 ENCOUNTER — Ambulatory Visit: Payer: 59 | Admitting: Internal Medicine

## 2024-03-20 ENCOUNTER — Encounter: Payer: Self-pay | Admitting: Internal Medicine

## 2024-03-20 ENCOUNTER — Ambulatory Visit: Payer: 59 | Attending: Internal Medicine | Admitting: Internal Medicine

## 2024-03-20 VITALS — BP 120/72 | HR 92 | Ht 62.0 in | Wt 220.8 lb

## 2024-03-20 DIAGNOSIS — E7849 Other hyperlipidemia: Secondary | ICD-10-CM

## 2024-03-20 DIAGNOSIS — R0609 Other forms of dyspnea: Secondary | ICD-10-CM

## 2024-03-20 DIAGNOSIS — I10 Essential (primary) hypertension: Secondary | ICD-10-CM

## 2024-03-20 NOTE — Patient Instructions (Signed)
 Medication Instructions:  Your physician recommends that you continue on your current medications as directed. Please refer to the Current Medication list given to you today.   Labwork: None  Testing/Procedures: None  Follow-Up: Your physician recommends that you schedule a follow-up appointment in: As needed  Any Other Special Instructions Will Be Listed Below (If Applicable). Thank you for choosing [facility]!      If you need a refill on your cardiac medications before your next appointment, please call your pharmacy.

## 2024-03-20 NOTE — Progress Notes (Signed)
 Cardiology Office Note  Date: 03/20/2024   ID: Emma Pittman, DOB 1961-06-17, MRN [id number]  PCP:  Elfredia Nevins, MD  Cardiologist:  Marjo Bicker, MD Electrophysiologist:  None   Reason for Office Visit: Evaluation of dyspnea at the request of Dr. Carlena Sax   History of Present Illness: Emma Pittman is a 63 y.o. female known to have HTN is here for follow-up visit.  Patient had COVID-19 infection in 11/2022 after which she had fatigue and DOE.  This was improving, she was scheduled for echocardiogram that showed normal LVEF and no valvular heart disease.  She also had chest pain at the time of COVID-19 infection and recently when she had flu.  No chest pain after the infection was resolved.  DOE also resolved.  She was referred to sleep medicine for evaluation of OSA.  Her symptoms were deemed to be secondary to grief reaction and depression, did not think she needs sleep study.  Overall doing great, no symptoms now.  Past Medical History:  Diagnosis Date   Anxiety    Hemorrhoids 03/22/2016   Hypercholesteremia    Hypertension    PONV (postoperative nausea and vomiting)    Spotting 03/06/2014   Vertigo    Vitiligo     Past Surgical History:  Procedure Laterality Date   APPENDECTOMY  age 48   CATARACT EXTRACTION W/PHACO  08/19/2011   Procedure: CATARACT EXTRACTION PHACO AND INTRAOCULAR LENS PLACEMENT (IOC);  Surgeon: Gemma Payor;  Location: [facility];  Service: Ophthalmology;  Laterality: Right;  CDE:9.50   CATARACT EXTRACTION W/PHACO  09/06/2011   Procedure: CATARACT EXTRACTION PHACO AND INTRAOCULAR LENS PLACEMENT (IOC);  Surgeon: Gemma Payor;  Location: [facility];  Service: Ophthalmology;  Laterality: Left;  CDE: 5.25   COLONOSCOPY     COLONOSCOPY N/A 09/08/2018   Procedure: COLONOSCOPY;  Surgeon: West Bali, MD;  Location: [facility];  Service: Endoscopy;  Laterality: N/A;  1:00   COLONOSCOPY WITH PROPOFOL N/A 01/06/2024   Procedure: COLONOSCOPY WITH PROPOFOL;   Surgeon: Lanelle Bal, DO;  Location: [facility];  Service: Endoscopy;  Laterality: N/A;  945am, asa 3 but ok for rm 1-2, pt knows to arrive at 6:30   ENDOMETRIAL ABLATION  8 yrs ago-eure   aph   POLYPECTOMY  09/08/2018   Procedure: POLYPECTOMY;  Surgeon: West Bali, MD;  Location: [facility];  Service: Endoscopy;;  colon   POLYPECTOMY  01/06/2024   Procedure: POLYPECTOMY;  Surgeon: Lanelle Bal, DO;  Location: [facility];  Service: Endoscopy;;   TUBAL LIGATION  17 yrs & 12 yrs    aph-Dr Ferguson:also had dye studies of tubes with opening of one tube    Current Outpatient Medications  Medication Sig Dispense Refill   acetaminophen (TYLENOL) 500 MG tablet Take 1,000 mg by mouth every 6 (six) hours as needed for moderate pain or headache.     atorvastatin (LIPITOR) 10 MG tablet Take 10 mg by mouth every morning.      azithromycin (ZITHROMAX) 250 MG tablet Take 1 tablet (250 mg total) by mouth daily. Take first 2 tablets together, then 1 every day until finished. 6 tablet 0   benzonatate (TESSALON) 100 MG capsule Take 100 mg by mouth 3 (three) times daily as needed.     escitalopram (LEXAPRO) 20 MG tablet Take 20 mg by mouth daily.     lisinopril-hydrochlorothiazide (PRINZIDE,ZESTORETIC) 10-12.5 MG per tablet Take 1 tablet by mouth every morning.  LORazepam (ATIVAN) 0.5 MG tablet TAKE 1 TABLET BY MOUTH EVERY 8 HOURS AS NEEDED FOR ANXIETY 15 tablet 0   meclizine (ANTIVERT) 25 MG tablet Take 25 mg by mouth 3 (three) times daily as needed for dizziness.     oseltamivir (TAMIFLU) 75 MG capsule Take 1 capsule (75 mg total) by mouth every 12 (twelve) hours. 10 capsule 0   VENTOLIN HFA 108 (90 Base) MCG/ACT inhaler SMARTSIG:2 Puff(s) By Mouth Every 4 Hours     No current facility-administered medications for this visit.   Facility-Administered Medications Ordered in Other Visits  Medication Dose Route Frequency Provider Last Rate Last Admin   fentaNYL (SUBLIMAZE) injection  25-50 mcg  25-50 mcg Intravenous Q5 min PRN Laurene Footman, MD       Allergies:  Patient has no known allergies.   Social History: The patient  reports that she quit smoking about 32 years ago. Her smoking use included cigarettes. She started smoking about 36 years ago. She has a 2 pack-year smoking history. She has never used smokeless tobacco. She reports that she does not currently use alcohol. She reports that she does not use drugs.   Family History: The patient's family history includes Cancer in her mother; Congestive Heart Failure in her brother and father; Heart attack in her mother; Kidney failure in her father.   ROS:  Please see the history of present illness. Otherwise, complete review of systems is positive for none.  All other systems are reviewed and negative.   Physical Exam: VS:  Ht 5\' 2"  (1.575 m)   Wt 220 lb 12.8 oz (100.2 kg)   BMI 40.38 kg/m , BMI Body mass index is 40.38 kg/m.  Wt Readings from Last 3 Encounters:  03/20/24 220 lb 12.8 oz (100.2 kg)  02/10/24 216 lb 14.9 oz (98.4 kg)  01/06/24 217 lb (98.4 kg)    General: Patient appears comfortable at rest. HEENT: Conjunctiva and lids normal, oropharynx clear with moist mucosa. Neck: Supple, no elevated JVP or carotid bruits, no thyromegaly. Lungs: Clear to auscultation, nonlabored breathing at rest. Cardiac: Regular rate and rhythm, no S3 or significant systolic murmur, no pericardial rub. Abdomen: Soft, nontender, no hepatomegaly, bowel sounds present, no guarding or rebound. Extremities: No pitting edema, distal pulses 2+. Skin: Warm and dry. Musculoskeletal: No kyphosis. Neuropsychiatric: Alert and oriented x3, affect grossly appropriate.  ECG:  NSR  Recent Labwork: 11/30/2023: ALT 13; AST 22 02/10/2024: BUN 11; Creatinine, Ser 0.66; Hemoglobin 12.9; Platelets 224; Potassium 3.3; Sodium 133     Component Value Date/Time   CHOL 199 11/30/2023 1125   TRIG 65 11/30/2023 1125   HDL 72 11/30/2023 1125    CHOLHDL 2.8 11/30/2023 1125   CHOLHDL 3.4 08/03/2009 2230   VLDL 9 08/03/2009 2230   LDLCALC 115 (H) 11/30/2023 1125    Other Studies Reviewed Today:   Assessment and Plan:  DOE, resolved: Initially referred to cardiology clinic for evaluation DOE which is likely secondary to post-COVID.  Does not have any DOE anymore.  No leg swelling.  Echocardiogram in 2024 showed normal LVEF and no valvular heart disease.  No need of stress test.  In the future, if she develops any new symptoms of angina or DOE, will need noninvasive ischemia evaluation.  OSA screening: She had symptoms of snoring, tired, HTN etc.  She was referred to sleep medicine but they did not think she needs sleep apnea testing as symptoms were deemed to be secondary to depression and grief.  HTN, controlled:  Continue current medications, lisinopril hypodensity seen 10-4.5 mg once daily.  HLD, not at goal: Continue atorvastatin 10 mg nightly.  Goal LDL less than 100.  Follows up with PCP.    Medication Adjustments/Labs and Tests Ordered: Current medicines are reviewed at length with the patient today.  Concerns regarding medicines are outlined above.   Tests Ordered: No orders of the defined types were placed in this encounter.    Medication Changes: No orders of the defined types were placed in this encounter.   Disposition:  Follow up PRN  Signed Yoshika Vensel Verne Spurr, MD, 03/20/2024 11:28 AM    [facility] at [facility] [address] [place], [place], [place] [address]

## 2024-05-28 ENCOUNTER — Other Ambulatory Visit: Payer: Self-pay | Admitting: Adult Health

## 2024-07-25 ENCOUNTER — Other Ambulatory Visit (HOSPITAL_COMMUNITY): Payer: Self-pay

## 2024-07-25 ENCOUNTER — Telehealth: Payer: Self-pay | Admitting: Pharmacy Technician

## 2024-07-25 ENCOUNTER — Ambulatory Visit (INDEPENDENT_AMBULATORY_CARE_PROVIDER_SITE_OTHER): Payer: Self-pay

## 2024-07-25 VITALS — BP 135/79 | HR 80 | Ht 62.0 in | Wt 221.0 lb

## 2024-07-25 DIAGNOSIS — I10 Essential (primary) hypertension: Secondary | ICD-10-CM | POA: Diagnosis not present

## 2024-07-25 DIAGNOSIS — Z6841 Body Mass Index (BMI) 40.0 and over, adult: Secondary | ICD-10-CM | POA: Diagnosis not present

## 2024-07-25 DIAGNOSIS — E7849 Other hyperlipidemia: Secondary | ICD-10-CM

## 2024-07-25 MED ORDER — WEGOVY 0.25 MG/0.5ML ~~LOC~~ SOAJ: 0.2500 mg | SUBCUTANEOUS | 1 refills | Status: DC

## 2024-07-25 NOTE — Telephone Encounter (Signed)
 Pharmacy Patient Advocate Encounter   Received notification from CoverMyMeds that prior authorization for Wegovy  0.25MG /0.5ML auto-injectors is required/requested.   Insurance verification completed.   The patient is insured through [place] .   Not covered under Medicare Part D when used solely for weight loss. Does patient have any of the following to meet CV reduction risk criteria?  Does pt have evidence of any of the following: Myocardial infarction (MI), (2) Prior ischemic or hemorrhagic stroke, (3) Symptomatic peripheral arterial disease (PAD) as evidence by one of the following (a) Intermittent claudication with ankle-brachial index (ABI) less than 0.85 (at rest); (b) Peripheral arterial revascularization procedure; (c) Amputation due to atherosclerotic disease?

## 2024-07-25 NOTE — Assessment & Plan Note (Signed)
 HTN is well-controlled with current dose of lisinopril-hydrochlorothiazide 10-12.5 mg.  No changes made today.  Refills not needed at this time.  Recommend low-sodium diet and regular exercise to help maintain optimal blood pressure.

## 2024-07-25 NOTE — Assessment & Plan Note (Signed)
 She reports eating well-balanced diet and small portions.  Is started working out at the gym again recently.  Will resubmit Wegovy  to see if insurance will cover this.

## 2024-07-25 NOTE — Progress Notes (Signed)
 New Patient Office Visit  Subjective    Patient ID: Emma Pittman, female    DOB: July 01, 1961  Age: 63 y.o. MRN: [id number]  CC:  Chief Complaint  Patient presents with   Establish Care    Pt here to establish care,     HPI Emma Pittman presents to establish care.  She was previously patient of Dr. Bertell and Dr. Marvine.  History of Present Illness Discussed the use of AI scribe software for clinical note transcription with the patient, who gave verbal consent to proceed.  History of Present Illness   Emma Pittman is a 62 year old female with hypertension and hyperlipidemia who presents to establish care.  Weight management and metabolic risk - Concerned about weight due to hypertension and hyperlipidemia - Previously prescribed weight loss injections, but insurance (Medicaid and Medicare) did not cover them - Actively exercises at the gym - Attempts to maintain a balanced diet, including meals and snacks such as fruits and nuts - Interested in weight loss to prevent diabetes - No current diagnosis of diabetes, but concerned about blood glucose levels  Hypertension and hyperlipidemia - Currently taking medication for hypertension and hyperlipidemia  Sleep-disordered breathing - Underwent sleep study in August - Informed she was 'going into' sleep apnea - Not currently using a CPAP machine - No symptoms of apnea such as cessation of breathing during sleep or waking up gasping for air  Vertigo and otologic symptoms - Occasional vertigo - Suspects fluid in ear, possibly after a recent shower - Uses swimmer's ear drops for symptom relief       Outpatient Encounter Medications as of 07/25/2024  Medication Sig   atorvastatin (LIPITOR) 10 MG tablet Take 10 mg by mouth every morning.    escitalopram  (LEXAPRO ) 20 MG tablet Take 20 mg by mouth daily.   lisinopril-hydrochlorothiazide (PRINZIDE,ZESTORETIC) 10-12.5 MG per tablet Take 1 tablet by mouth every morning.     LORazepam  (ATIVAN ) 0.5 MG tablet TAKE 1 TABLET BY MOUTH EVERY 8 HOURS AS NEEDED FOR ANXIETY   meclizine  (ANTIVERT ) 25 MG tablet Take 25 mg by mouth 3 (three) times daily as needed for dizziness.   semaglutide -weight management (WEGOVY ) 0.25 MG/0.5ML SOAJ SQ injection Inject 0.25 mg into the skin every 7 (seven) days.   VENTOLIN  HFA 108 (90 Base) MCG/ACT inhaler SMARTSIG:2 Puff(s) By Mouth Every 4 Hours (Patient taking differently: 2 puffs every 4 (four) hours as needed. As needed)   [DISCONTINUED] acetaminophen  (TYLENOL ) 500 MG tablet Take 1,000 mg by mouth every 6 (six) hours as needed for moderate pain or headache. (Patient not taking: Reported on 07/25/2024)   Facility-Administered Encounter Medications as of 07/25/2024  Medication   fentaNYL  (SUBLIMAZE ) injection 25-50 mcg    Past Medical History:  Diagnosis Date   Anxiety    Hemorrhoids 03/22/2016   Hypercholesteremia    Hypertension    PONV (postoperative nausea and vomiting)    Spotting 03/06/2014   Vertigo    Vitiligo     Past Surgical History:  Procedure Laterality Date   APPENDECTOMY  age 50   CATARACT EXTRACTION W/PHACO  08/19/2011   Procedure: CATARACT EXTRACTION PHACO AND INTRAOCULAR LENS PLACEMENT (IOC);  Surgeon: Cherene Mania;  Location: [facility];  Service: Ophthalmology;  Laterality: Right;  CDE:9.50   CATARACT EXTRACTION W/PHACO  09/06/2011   Procedure: CATARACT EXTRACTION PHACO AND INTRAOCULAR LENS PLACEMENT (IOC);  Surgeon: Cherene Mania;  Location: [facility];  Service: Ophthalmology;  Laterality: Left;  CDE: 5.25  COLONOSCOPY     COLONOSCOPY N/A 09/08/2018   Procedure: COLONOSCOPY;  Surgeon: Harvey Margo CROME, MD;  Location: [facility];  Service: Endoscopy;  Laterality: N/A;  1:00   COLONOSCOPY WITH PROPOFOL  N/A 01/06/2024   Procedure: COLONOSCOPY WITH PROPOFOL ;  Surgeon: Cindie Carlin POUR, DO;  Location: [facility];  Service: Endoscopy;  Laterality: N/A;  945am, asa 3 but ok for rm 1-2, pt knows to arrive at 6:30    ENDOMETRIAL ABLATION  8 yrs ago-eure   aph   POLYPECTOMY  09/08/2018   Procedure: POLYPECTOMY;  Surgeon: Harvey Margo CROME, MD;  Location: [facility];  Service: Endoscopy;;  colon   POLYPECTOMY  01/06/2024   Procedure: POLYPECTOMY;  Surgeon: Cindie Carlin POUR, DO;  Location: [facility];  Service: Endoscopy;;   TUBAL LIGATION  17 yrs & 12 yrs    aph-Dr Ferguson:also had dye studies of tubes with opening of one tube    Family History  Problem Relation Age of Onset   Heart attack Mother    Cancer Mother        colon   Kidney failure Father    Congestive Heart Failure Father    Congestive Heart Failure Brother        colon polyps   Anesthesia problems Neg Hx    Hypotension Neg Hx    Malignant hyperthermia Neg Hx    Pseudochol deficiency Neg Hx     Social History   Socioeconomic History   Marital status: Widowed    Spouse name: Not on file   Number of children: 1   Years of education: Not on file   Highest education level: 10th grade  Occupational History   Not on file  Tobacco Use   Smoking status: Former    Current packs/day: 0.00    Average packs/day: 0.5 packs/day for 4.0 years (2.0 ttl pk-yrs)    Types: Cigarettes    Start date: 08/12/1987    Quit date: 08/12/1991    Years since quitting: 32.9   Smokeless tobacco: Never  Vaping Use   Vaping status: Never Used  Substance and Sexual Activity   Alcohol use: Not Currently    Comment: occassional   Drug use: No   Sexual activity: Not Currently    Birth control/protection: Surgical, Post-menopausal    Comment: tubal and ablation  Other Topics Concern   Not on file  Social History Narrative   Not on file   Social Drivers of Health   Financial Resource Strain: Medium Risk (07/21/2024)   Overall Financial Resource Strain (CARDIA)    Difficulty of Paying Living Expenses: Somewhat hard  Food Insecurity: No Food Insecurity (07/21/2024)   Hunger Vital Sign    Worried About Running Out of Food in the Last Year: Never  true    Ran Out of Food in the Last Year: Never true  Transportation Needs: No Transportation Needs (07/21/2024)   PRAPARE - Administrator, Civil Service (Medical): No    Lack of Transportation (Non-Medical): No  Physical Activity: Sufficiently Active (07/21/2024)   Exercise Vital Sign    Days of Exercise per Week: 3 days    Minutes of Exercise per Session: 60 min  Stress: Stress Concern Present (07/21/2024)   [employer] of Occupational Health - Occupational Stress Questionnaire    Feeling of Stress: Rather much  Social Connections: Moderately Isolated (07/21/2024)   Social Connection and Isolation Panel    Frequency of Communication with Friends and Family: More  than three times a week    Frequency of Social Gatherings with Friends and Family: More than three times a week    Attends Religious Services: 1 to 4 times per year    Active Member of [employer] or Organizations: No    Attends Banker Meetings: Not on file    Marital Status: Widowed  Intimate Partner Violence: Not At Risk (11/30/2023)   Humiliation, Afraid, Rape, and Kick questionnaire    Fear of Current or Ex-Partner: No    Emotionally Abused: No    Physically Abused: No    Sexually Abused: No    ROS      Objective    BP 135/79   Pulse 80   Ht 5' 2 (1.575 m)   Wt 221 lb 0.6 oz (100.3 kg)   SpO2 93%   BMI 40.43 kg/m   Physical Exam Vitals and nursing note reviewed.  Constitutional:      Appearance: Normal appearance.  HENT:     Head: Normocephalic.     Right Ear: Tympanic membrane, ear canal and external ear normal.     Left Ear: Ear canal and external ear normal. A middle ear effusion is present.     Nose: Nose normal.     Mouth/Throat:     Mouth: Mucous membranes are moist.     Pharynx: Oropharynx is clear.  Eyes:     Extraocular Movements: Extraocular movements intact.     Pupils: Pupils are equal, round, and reactive to light.  Cardiovascular:     Rate and Rhythm: Normal  rate and regular rhythm.  Pulmonary:     Effort: Pulmonary effort is normal.     Breath sounds: Normal breath sounds.  Musculoskeletal:     Cervical back: Normal range of motion and neck supple.  Skin:    General: Skin is warm and dry.  Neurological:     Mental Status: She is alert and oriented to person, place, and time.  Psychiatric:        Mood and Affect: Mood normal.        Thought Content: Thought content normal.         Assessment & Plan:   Problem List Items Addressed This Visit       Cardiovascular and Mediastinum   HTN, goal below 130/80 (Chronic)   HTN is well-controlled with current dose of lisinopril-hydrochlorothiazide 10-12.5 mg.  No changes made today.  Refills not needed at this time.  Recommend low-sodium diet and regular exercise to help maintain optimal blood pressure.      Relevant Orders   CMP14+EGFR     Other   HLD (hyperlipidemia) - Primary (Chronic)   Currently on atorvastatin 10 mg.  Last labs were in December 2024.  Will update fasting labs today.  Follow-up according to lab results.  No refills needed at this time.      Relevant Orders   Lipid Profile   CMP14+EGFR   TSH + free T4   BMI 40.0-44.9, adult (HCC)   She reports eating well-balanced diet and small portions.  Is started working out at the gym again recently.  Will resubmit Wegovy  to see if insurance will cover this.      Relevant Medications   semaglutide -weight management (WEGOVY ) 0.25 MG/0.5ML SOAJ SQ injection   Other Relevant Orders   HgB A1c           No follow-ups on file.   Leita Longs, FNP

## 2024-07-25 NOTE — Assessment & Plan Note (Signed)
 Currently on atorvastatin 10 mg.  Last labs were in December 2024.  Will update fasting labs today.  Follow-up according to lab results.  No refills needed at this time.

## 2024-07-26 ENCOUNTER — Other Ambulatory Visit: Payer: Self-pay

## 2024-07-26 DIAGNOSIS — Z6841 Body Mass Index (BMI) 40.0 and over, adult: Secondary | ICD-10-CM

## 2024-07-26 LAB — TSH+FREE T4
Free T4: 1 ng/dL (ref 0.82–1.77)
TSH: 3.33 u[IU]/mL (ref 0.450–4.500)

## 2024-07-26 LAB — CMP14+EGFR
ALT: 18 IU/L (ref 0–32)
AST: 23 IU/L (ref 0–40)
Albumin: 4.2 g/dL (ref 3.8–4.9)
Alkaline Phosphatase: 75 IU/L (ref 44–121)
BUN/Creatinine Ratio: 25 (ref 12–28)
BUN: 18 mg/dL (ref 8–27)
Bilirubin Total: 0.6 mg/dL (ref 0.0–1.2)
CO2: 24 mmol/L (ref 20–29)
Calcium: 9.5 mg/dL (ref 8.7–10.3)
Chloride: 102 mmol/L (ref 96–106)
Creatinine, Ser: 0.71 mg/dL (ref 0.57–1.00)
Globulin, Total: 2.4 g/dL (ref 1.5–4.5)
Glucose: 89 mg/dL (ref 70–99)
Potassium: 3.9 mmol/L (ref 3.5–5.2)
Sodium: 141 mmol/L (ref 134–144)
Total Protein: 6.6 g/dL (ref 6.0–8.5)
eGFR: 97 mL/min/1.73 (ref >59)

## 2024-07-26 LAB — LIPID PANEL
Chol/HDL Ratio: 2.9 ratio (ref 0.0–4.4)
Cholesterol, Total: 195 mg/dL (ref 100–199)
HDL: 68 mg/dL (ref >39)
LDL Chol Calc (NIH): 115 mg/dL — ABNORMAL HIGH (ref 0–99)
Triglycerides: 66 mg/dL (ref 0–149)
VLDL Cholesterol Cal: 12 mg/dL (ref 5–40)

## 2024-07-26 LAB — HEMOGLOBIN A1C
Est. average glucose Bld gHb Est-mCnc: 108 mg/dL
Hgb A1c MFr Bld: 5.4 % (ref 4.8–5.6)

## 2024-07-26 MED ORDER — PHENTERMINE HCL 37.5 MG PO TABS: 18.7500 mg | ORAL_TABLET | Freq: Every day | ORAL | 1 refills | Status: DC

## 2024-09-09 ENCOUNTER — Ambulatory Visit: Payer: Self-pay

## 2024-09-13 ENCOUNTER — Other Ambulatory Visit (HOSPITAL_COMMUNITY): Payer: Self-pay

## 2024-09-13 DIAGNOSIS — Z1231 Encounter for screening mammogram for malignant neoplasm of breast: Secondary | ICD-10-CM

## 2024-09-17 ENCOUNTER — Ambulatory Visit (INDEPENDENT_AMBULATORY_CARE_PROVIDER_SITE_OTHER)

## 2024-09-17 DIAGNOSIS — Z23 Encounter for immunization: Secondary | ICD-10-CM

## 2024-10-08 ENCOUNTER — Other Ambulatory Visit: Payer: Self-pay

## 2024-10-08 DIAGNOSIS — E7849 Other hyperlipidemia: Secondary | ICD-10-CM

## 2024-10-08 MED ORDER — ATORVASTATIN CALCIUM 10 MG PO TABS: 10.0000 mg | ORAL_TABLET | Freq: Every morning | ORAL | 3 refills | Status: AC

## 2024-10-11 ENCOUNTER — Ambulatory Visit

## 2024-10-11 VITALS — BP 135/78 | Ht 62.0 in | Wt 208.0 lb

## 2024-10-11 DIAGNOSIS — Z0000 Encounter for general adult medical examination without abnormal findings: Secondary | ICD-10-CM

## 2024-10-11 NOTE — Patient Instructions (Signed)
 Emma Pittman,  Thank you for taking the time for your Medicare Wellness Visit. I appreciate your continued commitment to your health goals. Please review the care plan we discussed, and feel free to reach out if I can assist you further.  Medicare recommends these wellness visits once per year to help you and your care team stay ahead of potential health issues. These visits are designed to focus on prevention, allowing your provider to concentrate on managing your acute and chronic conditions during your regular appointments.  Please note that Annual Wellness Visits do not include a physical exam. Some assessments may be limited, especially if the visit was conducted virtually. If needed, we may recommend a separate in-person follow-up with your provider.  Wishing you excellent health and many blessings in the year to come!  -Khaleel Beckom, CMA  Ongoing Care Seeing your primary care provider every 3 to 6 months helps us  monitor your health and provide consistent, personalized care.   Recommended Screenings:  Health Maintenance  Topic Date Due   COVID-19 Vaccine (1) Never done   HIV Screening  Never done   Hepatitis C Screening  Never done   DTaP/Tdap/Td vaccine (1 - Tdap) Never done   Pneumococcal Vaccine for age over 100 (1 of 2 - PCV) Never done   Zoster (Shingles) Vaccine (1 of 2) Never done   Breast Cancer Screening  10/27/2024   Medicare Annual Wellness Visit  10/11/2025   Pap with HPV screening  11/29/2028   Colon Cancer Screening  01/05/2029   Flu Shot  Completed   Hepatitis B Vaccine  Aged Out   HPV Vaccine  Aged Out   Meningitis B Vaccine  Aged Out       10/11/2024    1:59 PM  Advanced Directives  Does Patient Have a Medical Advance Directive? No  Would patient like information on creating a medical advance directive? No - Patient declined   Advance Care Planning is important because it: Ensures you receive medical care that aligns with your values, goals, and  preferences. Provides guidance to your family and loved ones, reducing the emotional burden of decision-making during critical moments.  Vision: Annual vision screenings are recommended for early detection of glaucoma, cataracts, and diabetic retinopathy. These exams can also reveal signs of chronic conditions such as diabetes and high blood pressure.  Dental: Annual dental screenings help detect early signs of oral cancer, gum disease, and other conditions linked to overall health, including heart disease and diabetes.  Please see the attached documents for additional preventive care recommendations.

## 2024-10-11 NOTE — Progress Notes (Signed)
 Patient needs labs. Please order if appropriate for this patient to close care gaps.  Please attest and cosign this visit due to patients primary care provider not being immediately available at the time the visit was completed.   Subjective:   Emma Pittman is a 62 y.o. who presents for a Medicare Wellness preventive visit. As a reminder, Annual Wellness Visits don't include a physical exam, and some assessments may be limited, especially if this visit is performed virtually. We may recommend an in-person follow-up visit with your provider if needed.  Visit Complete: Virtual I connected with  Emma Pittman on 10/11/24 by a audio enabled telemedicine application and verified that I am speaking with the correct person using two identifiers.  Patient Location: Home  Provider Location: Home Office  I discussed the limitations of evaluation and management by telemedicine. The patient expressed understanding and agreed to proceed.  Vital Signs: Because this visit was a virtual/telehealth visit, some criteria may be missing or patient reported. Any vitals not documented were not able to be obtained and vitals that have been documented are patient reported. VideoDeclined- This patient declined Librarian, academic. Therefore the visit was completed with audio only.  Persons Participating in Visit: Patient.  AWV Questionnaire: Yes: Patient Medicare AWV questionnaire was completed by the patient on 10/11/2024; I have confirmed that all information answered by patient is correct and no changes since this date. Cardiac Risk Factors include: dyslipidemia;hypertension;obesity (BMI >30kg/m2)     Objective:    Today's Vitals   10/11/24 1405  BP: 135/78  Weight: 208 lb (94.3 kg)  Height: 5' 2 (1.575 m)   Body mass index is 38.04 kg/m.    10/11/2024    1:59 PM 01/06/2024    6:34 AM 05/02/2019    3:39 PM 09/08/2018   12:14 PM 08/10/2016    8:08 AM 08/22/2014     8:26 PM 09/06/2011    8:36 AM  Advanced Directives  Does Patient Have a Medical Advance Directive? No Yes No No  No  No    Type of Advance Directive  Living will       Would patient like information on creating a medical advance directive? No - Patient declined   No - Patient declined  No - patient declined information  No - patient declined information    Pre-existing out of facility DNR order (yellow form or pink MOST form)       No      Data saved with a previous flowsheet row definition    Current Medications (verified) Outpatient Encounter Medications as of 10/11/2024  Medication Sig   atorvastatin (LIPITOR) 10 MG tablet Take 1 tablet (10 mg total) by mouth every morning.   escitalopram  (LEXAPRO ) 20 MG tablet Take 20 mg by mouth daily.   lisinopril-hydrochlorothiazide (PRINZIDE,ZESTORETIC) 10-12.5 MG per tablet Take 1 tablet by mouth every morning.    LORazepam  (ATIVAN ) 0.5 MG tablet TAKE 1 TABLET BY MOUTH EVERY 8 HOURS AS NEEDED FOR ANXIETY   meclizine  (ANTIVERT ) 25 MG tablet Take 25 mg by mouth 3 (three) times daily as needed for dizziness.   phentermine  (ADIPEX-P ) 37.5 MG tablet Take 0.5-1 tablets (18.75-37.5 mg total) by mouth daily before breakfast.   VENTOLIN  HFA 108 (90 Base) MCG/ACT inhaler SMARTSIG:2 Puff(s) By Mouth Every 4 Hours (Patient taking differently: 2 puffs every 4 (four) hours as needed. As needed)   Facility-Administered Encounter Medications as of 10/11/2024  Medication   fentaNYL  (SUBLIMAZE ) injection  25-50 mcg    Allergies (verified) Patient has no known allergies.   History: Past Medical History:  Diagnosis Date   Anxiety    Hemorrhoids 03/22/2016   Hypercholesteremia    Hypertension    PONV (postoperative nausea and vomiting)    Spotting 03/06/2014   Vertigo    Vitiligo    Past Surgical History:  Procedure Laterality Date   APPENDECTOMY  age 50   CATARACT EXTRACTION W/PHACO  08/19/2011   Procedure: CATARACT EXTRACTION PHACO AND INTRAOCULAR  LENS PLACEMENT (IOC);  Surgeon: Cherene Mania;  Location: [facility];  Service: Ophthalmology;  Laterality: Right;  CDE:9.50   CATARACT EXTRACTION W/PHACO  09/06/2011   Procedure: CATARACT EXTRACTION PHACO AND INTRAOCULAR LENS PLACEMENT (IOC);  Surgeon: Cherene Mania;  Location: [facility];  Service: Ophthalmology;  Laterality: Left;  CDE: 5.25   COLONOSCOPY     COLONOSCOPY N/A 09/08/2018   Procedure: COLONOSCOPY;  Surgeon: Harvey Margo CROME, MD;  Location: [facility];  Service: Endoscopy;  Laterality: N/A;  1:00   COLONOSCOPY WITH PROPOFOL  N/A 01/06/2024   Procedure: COLONOSCOPY WITH PROPOFOL ;  Surgeon: Cindie Carlin POUR, DO;  Location: [facility];  Service: Endoscopy;  Laterality: N/A;  945am, asa 3 but ok for rm 1-2, pt knows to arrive at 6:30   ENDOMETRIAL ABLATION  8 yrs ago-eure   aph   POLYPECTOMY  09/08/2018   Procedure: POLYPECTOMY;  Surgeon: Harvey Margo CROME, MD;  Location: [facility];  Service: Endoscopy;;  colon   POLYPECTOMY  01/06/2024   Procedure: POLYPECTOMY;  Surgeon: Cindie Carlin POUR, DO;  Location: [facility];  Service: Endoscopy;;   TUBAL LIGATION  17 yrs & 12 yrs    aph-Dr Ferguson:also had dye studies of tubes with opening of one tube   Family History  Problem Relation Age of Onset   Heart attack Mother    Cancer Mother        colon   Kidney failure Father    Congestive Heart Failure Father    Congestive Heart Failure Brother        colon polyps   Anesthesia problems Neg Hx    Hypotension Neg Hx    Malignant hyperthermia Neg Hx    Pseudochol deficiency Neg Hx    Social History   Socioeconomic History   Marital status: Widowed    Spouse name: Not on file   Number of children: 1   Years of education: Not on file   Highest education level: 10th grade  Occupational History   Not on file  Tobacco Use   Smoking status: Former    Current packs/day: 0.00    Average packs/day: 0.5 packs/day for 4.0 years (2.0 ttl pk-yrs)    Types: Cigarettes    Start date: 08/12/1987     Quit date: 08/12/1991    Years since quitting: 33.1   Smokeless tobacco: Never  Vaping Use   Vaping status: Never Used  Substance and Sexual Activity   Alcohol use: Not Currently    Comment: occassional   Drug use: No   Sexual activity: Not Currently    Birth control/protection: Surgical, Post-menopausal    Comment: tubal and ablation  Other Topics Concern   Not on file  Social History Narrative   Not on file   Social Drivers of Health   Financial Resource Strain: Medium Risk (10/11/2024)   Overall Financial Resource Strain (CARDIA)    Difficulty of Paying Living Expenses: Somewhat hard  Food Insecurity: No Food Insecurity (10/07/2024)  Hunger Vital Sign    Worried About Running Out of Food in the Last Year: Never true    Ran Out of Food in the Last Year: Never true  Transportation Needs: No Transportation Needs (10/07/2024)   PRAPARE - Administrator, Civil Service (Medical): No    Lack of Transportation (Non-Medical): No  Physical Activity: Sufficiently Active (10/07/2024)   Exercise Vital Sign    Days of Exercise per Week: 3 days    Minutes of Exercise per Session: 60 min  Stress: Stress Concern Present (10/07/2024)   [employer] of Occupational Health - Occupational Stress Questionnaire    Feeling of Stress: To some extent  Social Connections: Socially Isolated (10/07/2024)   Social Connection and Isolation Panel    Frequency of Communication with Friends and Family: More than three times a week    Frequency of Social Gatherings with Friends and Family: More than three times a week    Attends Religious Services: Never    Database Administrator or Organizations: No    Attends Banker Meetings: Never    Marital Status: Widowed    Tobacco Counseling Counseling given: Yes   Clinical Intake: Pre-visit preparation completed: Yes Pain : No/denies pain   BMI - recorded: 38.04 Nutritional Status: BMI > 30  Obese Nutritional Risks:  None Diabetes: No Lab Results  Component Value Date   HGBA1C 5.4 07/25/2024    How often do you need to have someone help you when you read instructions, pamphlets, or other written materials from your doctor or pharmacy?: 1 - Never Interpreter Needed?: No Information entered by :: Emma Pittman W CMA (AAMA)  Activities of Daily Living     10/07/2024   11:00 AM  In your present state of health, do you have any difficulty performing the following activities:  Hearing? 0  Vision? 0  Difficulty concentrating or making decisions? 0  Walking or climbing stairs? 0  Dressing or bathing? 0  Doing errands, shopping? 0  Preparing Food and eating ? N  Using the Toilet? N  In the past six months, have you accidently leaked urine? Y  Do you have problems with loss of bowel control? N  Managing your Medications? N  Managing your Finances? N  Housekeeping or managing your Housekeeping? N   Patient Care Team: Bevely Doffing, FNP as PCP - General (Family Medicine) Stacia Diannah SQUIBB, MD as Consulting Physician (Cardiology) Signa Delon LABOR, NP as Nurse Practitioner (Obstetrics and Gynecology) Cindie Carlin POUR, DO as Consulting Physician (Gastroenterology) Jayne Vonn DEL, MD as Consulting Physician (Obstetrics and Gynecology)  I have updated your Care Teams any recent Medical Services you may have received from other providers in the past year.     Assessment:   This is a routine wellness examination for Emma Pittman.  Hearing/Vision screen Hearing Screening - Comments:: Patient denies any hearing difficulties.   Vision Screening - Comments:: Wears rx glasses - up to date with routine eye exams with  Oneil Kawasaki  Goals Addressed               This Visit's Progress     remain active and healthy (pt-stated)          Depression Screen     10/11/2024    2:14 PM 07/25/2024    8:21 AM 11/30/2023   10:14 AM 11/15/2022    8:36 AM 11/12/2021    8:35 AM 11/11/2020    1:35 PM 06/09/2018  9:11 AM  PHQ 2/9 Scores  PHQ - 2 Score 0 2 2 3 3 6  0  PHQ- 9 Score 0 6 8 8 9 20 2      Fall Risk     10/07/2024   11:00 AM 07/25/2024    8:21 AM 11/30/2023   10:17 AM 11/15/2022    8:37 AM 11/12/2021    8:32 AM  Fall Risk   Falls in the past year? 0 0 0 0 0  Number falls in past yr: 0  0 0   Injury with Fall? 0  0 0   Risk for fall due to : No Fall Risks No Fall Risks  No Fall Risks   Follow up Falls evaluation completed;Education provided;Falls prevention discussed Falls evaluation completed  Falls evaluation completed       Data saved with a previous flowsheet row definition    MEDICARE RISK AT HOME:  Medicare Risk at Home Any stairs in or around the home?: (Patient-Rptd) Yes If so, are there any without handrails?: (Patient-Rptd) Yes Home free of loose throw rugs in walkways, pet beds, electrical cords, etc?: (Patient-Rptd) Yes Adequate lighting in your home to reduce risk of falls?: (Patient-Rptd) Yes Life alert?: (Patient-Rptd) No Use of a cane, walker or w/c?: (Patient-Rptd) No Grab bars in the bathroom?: (Patient-Rptd) No Shower chair or bench in shower?: (Patient-Rptd) No Elevated toilet seat or a handicapped toilet?: (Patient-Rptd) No  TIMED UP AND GO: Was the test performed?  No  Cognitive Function: 6CIT completed        10/11/2024    2:14 PM  6CIT Screen  What Year? 0 points  What month? 0 points  What time? 0 points  Count back from 20 0 points  Months in reverse 0 points  Repeat phrase 0 points  Total Score 0 points    Immunizations Immunization History  Administered Date(s) Administered   Influenza, Seasonal, Injecte, Preservative Fre 09/17/2024    Screening Tests Health Maintenance  Topic Date Due   COVID-19 Vaccine (1) Never done   HIV Screening  Never done   Hepatitis C Screening  Never done   DTaP/Tdap/Td (1 - Tdap) Never done   Pneumococcal Vaccine: 50+ Years (1 of 2 - PCV) Never done   Zoster Vaccines- Shingrix (1 of 2) Never done    Mammogram  10/27/2024   Medicare Annual Wellness (AWV)  10/11/2025   Cervical Cancer Screening (HPV/Pap Cotest)  11/29/2028   Colonoscopy  01/05/2029   Hepatitis B Vaccines 19-59 Average Risk  Aged Out   HPV VACCINES  Aged Out   Meningococcal B Vaccine  Aged Out    Health Maintenance Health Maintenance Due  Topic Date Due   COVID-19 Vaccine (1) Never done   HIV Screening  Never done   Hepatitis C Screening  Never done   DTaP/Tdap/Td (1 - Tdap) Never done   Pneumococcal Vaccine: 50+ Years (1 of 2 - PCV) Never done   Zoster Vaccines- Shingrix (1 of 2) Never done   Mammogram  10/27/2024   Health Maintenance Items Addressed: Mammogram is scheduled. Patient is aware of recommended vaccines.   Additional Screening: Vision Screening: Recommended annual ophthalmology exams for early detection of glaucoma and other disorders of the eye. Would you like a referral to an eye doctor? No    Dental Screening: Recommended annual dental exams for proper oral hygiene  Community Resource Referral / Chronic Care Management: CRR required this visit?  No   CCM required this visit?  No  Plan:   I have personally reviewed and noted the following in the patient's chart:   Medical and social history Use of alcohol, tobacco or illicit drugs  Current medications and supplements including opioid prescriptions. Patient is not currently taking opioid prescriptions. Functional ability and status Nutritional status Physical activity Advanced directives List of other physicians Hospitalizations, surgeries, and ER visits in previous 12 months Vitals Screenings to include cognitive, depression, and falls Referrals and appointments  In addition, I have reviewed and discussed with patient certain preventive protocols, quality metrics, and best practice recommendations. A written personalized care plan for preventive services as well as general preventive health recommendations were provided to  patient.   Emma Pittman, CMA   10/11/2024   After Visit Summary: (MyChart) Due to this being a telephonic visit, the after visit summary with patients personalized plan was offered to patient via MyChart   Notes: Nothing significant to report at this time.

## 2024-10-29 ENCOUNTER — Ambulatory Visit (HOSPITAL_COMMUNITY)
Admission: RE | Admit: 2024-10-29 | Discharge: 2024-10-29 | Disposition: A | Discharge status: Home / Self Care | Source: Ambulatory Visit

## 2024-10-29 DIAGNOSIS — Z1231 Encounter for screening mammogram for malignant neoplasm of breast: Secondary | ICD-10-CM | POA: Insufficient documentation

## 2024-11-03 ENCOUNTER — Other Ambulatory Visit: Payer: Self-pay

## 2024-11-03 DIAGNOSIS — Z6841 Body Mass Index (BMI) 40.0 and over, adult: Secondary | ICD-10-CM

## 2024-11-07 ENCOUNTER — Other Ambulatory Visit: Payer: Self-pay

## 2024-11-07 DIAGNOSIS — I10 Essential (primary) hypertension: Secondary | ICD-10-CM

## 2024-11-07 MED ORDER — LISINOPRIL-HYDROCHLOROTHIAZIDE 10-12.5 MG PO TABS: 1.0000 | ORAL_TABLET | Freq: Every morning | ORAL | 3 refills | Status: AC

## 2024-11-12 ENCOUNTER — Ambulatory Visit: Payer: Self-pay

## 2024-11-12 NOTE — Telephone Encounter (Signed)
 FYI Only or Action Required?: FYI only for provider: appointment scheduled on 12.3.25.  Patient was last seen in primary care on 07/25/2024 by Bevely Doffing, FNP.  Called Nurse Triage reporting Pain.  Symptoms began several weeks ago.  Interventions attempted: OTC medications: tylenol .  Symptoms are: unchanged.  Triage Disposition: See PCP When Office is Open (Within 3 Days)  Patient/caregiver understands and will follow disposition?: Yes   Copied from CRM #[id number]. Topic: Clinical - Red Word Triage >> Nov 12, 2024  3:03 PM Avram MATSU wrote: Red Word that prompted transfer to Nurse Triage: sting, burning and sore arm maybe from a bite. Its been going on for 2 weeks   ----------------------------------------------------------------------- From previous Reason for Contact - Scheduling: Patient/patient representative is calling to schedule an appointment. Refer to attachments for appointment information. Reason for Disposition  [1] MODERATE pain (e.g., interferes with normal activities) AND [2] present > 3 days  Answer Assessment - Initial Assessment Questions Pt states a couple of weeks ago she was a sleep and woke up in the middle of the night and it felt like something stung her bit  her. Didn't see anything, it's in the area where one would get a shot. She denies any redness, discoloration, marks, swelling, fever, shortness of breath, or chest pain. She states when she wiggles her fingers or moves her arm the pain is worse. States the pain is constant, but varies from a 5-8/10. States she would feel her arm hurting more when she went to pick her up grandson, maybe even a little week. Pt denies all higher acuity questions.       1. ONSET: When did the pain start?     About 2 weeks ago 2. LOCATION: Where is the pain located?     Left arm where one would get a shot 3. PAIN: How bad is the pain? (Scale 0-10; or none, mild, moderate, severe)     5-8 4. WORK OR EXERCISE: Has  there been any recent work or exercise that involved this part of the body?     denies 5. CAUSE: What do you think is causing the arm pain?     unknown 6. OTHER SYMPTOMS: Do you have any other symptoms? (e.g., neck pain, swelling, rash, fever, numbness, weakness)     Slight weakness but could be from pain when lifting  Protocols used: Arm Pain-A-AH

## 2024-11-12 NOTE — Telephone Encounter (Signed)
Noted patient scheduled

## 2024-11-14 ENCOUNTER — Encounter: Payer: Self-pay | Admitting: Family Medicine

## 2024-11-14 ENCOUNTER — Ambulatory Visit: Admitting: Family Medicine

## 2024-11-14 VITALS — BP 139/73 | HR 81 | Ht 62.0 in | Wt 210.0 lb

## 2024-11-14 DIAGNOSIS — M7582 Other shoulder lesions, left shoulder: Secondary | ICD-10-CM | POA: Insufficient documentation

## 2024-11-14 MED ORDER — PREDNISONE 10 MG PO TABS: ORAL_TABLET | ORAL | 0 refills | Status: DC

## 2024-11-14 NOTE — Progress Notes (Signed)
 Subjective:  Patient ID: Emma Pittman, female    DOB: 1961/07/03  Age: 63 y.o. MRN: [id number]  CC:   Chief Complaint  Patient presents with   Arm Pain    Left arm pain for two weeks    HPI:  63 year old female presents for evaluation of the above.  2-week history of left lateral upper arm pain.  Started after she was doing some yard work.  Pain is sharp.  Moderate in severity.  Worse with certain activities.  Has used ice, Tylenol , ibuprofen , and a topical cream without resolution.  Denies neck pain.  No current numbness or tingling in the upper extremity.  No recent fall or trauma.  Patient Active Problem List   Diagnosis Date Noted   Rotator cuff tendinitis, left 11/14/2024   BMI 40.0-44.9, adult (HCC) 07/25/2024   Papanicolaou smear of cervix with positive high risk human papilloma virus (HPV) test 12/12/2023   HTN, goal below 130/80 02/14/2023   HLD (hyperlipidemia) 02/14/2023   DOE (dyspnea on exertion) 02/14/2023   Depression 11/11/2020   Grief reaction 11/11/2020   History of colonic polyps    Hemorrhoids 03/22/2016   Vitiligo 03/22/2016    Social Hx   Social History   Socioeconomic History   Marital status: Widowed    Spouse name: Not on file   Number of children: 1   Years of education: Not on file   Highest education level: 10th grade  Occupational History   Not on file  Tobacco Use   Smoking status: Former    Current packs/day: 0.00    Average packs/day: 0.5 packs/day for 4.0 years (2.0 ttl pk-yrs)    Types: Cigarettes    Start date: 08/12/1987    Quit date: 08/12/1991    Years since quitting: 33.2   Smokeless tobacco: Never  Vaping Use   Vaping status: Never Used  Substance and Sexual Activity   Alcohol use: Not Currently    Comment: occassional   Drug use: No   Sexual activity: Not Currently    Birth control/protection: Surgical, Post-menopausal    Comment: tubal and ablation  Other Topics Concern   Not on file  Social History Narrative    Not on file   Social Drivers of Health   Financial Resource Strain: Medium Risk (10/11/2024)   Overall Financial Resource Strain (CARDIA)    Difficulty of Paying Living Expenses: Somewhat hard  Food Insecurity: No Food Insecurity (10/07/2024)   Hunger Vital Sign    Worried About Running Out of Food in the Last Year: Never true    Ran Out of Food in the Last Year: Never true  Transportation Needs: No Transportation Needs (10/07/2024)   PRAPARE - Administrator, Civil Service (Medical): No    Lack of Transportation (Non-Medical): No  Physical Activity: Sufficiently Active (10/07/2024)   Exercise Vital Sign    Days of Exercise per Week: 3 days    Minutes of Exercise per Session: 60 min  Stress: Stress Concern Present (10/07/2024)   [employer] of Occupational Health - Occupational Stress Questionnaire    Feeling of Stress: To some extent  Social Connections: Socially Isolated (10/07/2024)   Social Connection and Isolation Panel    Frequency of Communication with Friends and Family: More than three times a week    Frequency of Social Gatherings with Friends and Family: More than three times a week    Attends Religious Services: Never    Database Administrator or Organizations:  No    Attends Banker Meetings: Never    Marital Status: Widowed    Review of Systems Per HPI  Objective:  BP 139/73   Pulse 81   Ht 5' 2 (1.575 m)   Wt 210 lb (95.3 kg)   SpO2 98%   BMI 38.41 kg/m      11/14/2024    9:15 AM 10/11/2024    2:05 PM 07/25/2024    8:13 AM  BP/Weight  Systolic BP 139 135 135  Diastolic BP 73 78 79  Wt. (Lbs) 210 208   BMI 38.41 kg/m2 38.04 kg/m2     Physical Exam Vitals and nursing note reviewed.  Constitutional:      General: She is not in acute distress.    Appearance: Normal appearance.  HENT:     Head: Normocephalic and atraumatic.  Pulmonary:     Effort: Pulmonary effort is normal. No respiratory distress.   Musculoskeletal:     Comments: Left shoulder: Inspection reveals no abnormalities, atrophy or asymmetry. Palpation is normal with no tenderness over AC joint or bicipital groove. Range of motion decreased in flexion. Rotator cuff strength 4/5 supraspinatus as well as infraspinatus/teres minor. + Hawkin's tests, empty can. Painful arc.   Neurological:     Mental Status: She is alert.     Lab Results  Component Value Date   WBC 6.7 02/10/2024   HGB 12.9 02/10/2024   HCT 38.6 02/10/2024   PLT 224 02/10/2024   GLUCOSE 89 07/25/2024   CHOL 195 07/25/2024   TRIG 66 07/25/2024   HDL 68 07/25/2024   LDLCALC 115 (H) 07/25/2024   ALT 18 07/25/2024   AST 23 07/25/2024   NA 141 07/25/2024   K 3.9 07/25/2024   CL 102 07/25/2024   CREATININE 0.71 07/25/2024   BUN 18 07/25/2024   CO2 24 07/25/2024   TSH 3.330 07/25/2024   HGBA1C 5.4 07/25/2024     Assessment & Plan:  Rotator cuff tendinitis, left Assessment & Plan: Tendinitis versus bursitis.  Empiric prednisone .  Gentle range of motion.  Supportive care.   Other orders -     predniSONE ; 50 mg daily x 2 days, then 40 mg daily x 2 days, then 30 mg daily x 2 days, then 20 mg daily x 2 days, then 10 mg daily x 2 days.  Dispense: 30 tablet; Refill: 0    Follow-up:  Return if symptoms worsen or fail to improve.  Jacqulyn Ahle DO [facility]

## 2024-11-14 NOTE — Patient Instructions (Signed)
 Medication as directed.  Gentle range of motion.  If persists, please let me know.

## 2024-11-14 NOTE — Assessment & Plan Note (Signed)
 Tendinitis versus bursitis.  Empiric prednisone .  Gentle range of motion.  Supportive care.

## 2024-11-15 ENCOUNTER — Other Ambulatory Visit: Payer: Self-pay | Admitting: Internal Medicine

## 2024-11-15 ENCOUNTER — Ambulatory Visit: Payer: Self-pay

## 2024-11-15 DIAGNOSIS — M7582 Other shoulder lesions, left shoulder: Secondary | ICD-10-CM

## 2024-11-15 MED ORDER — NAPROXEN 500 MG PO TABS: 500.0000 mg | ORAL_TABLET | Freq: Two times a day (BID) | ORAL | 0 refills | Status: DC

## 2024-11-15 NOTE — Telephone Encounter (Signed)
 FYI Only or Action Required?: Action required by provider: update on patient condition.  Patient was last seen in primary care on 11/14/2024 by Cook, Jayce G, DO.  Called Nurse Triage reporting Hypertension, Nausea, Dizziness, and Medication Reaction.  Symptoms began yesterday.  Interventions attempted: Prescription medications: prednisone .  Symptoms are: gradually worsening.  Triage Disposition: Call PCP Now  Patient/caregiver understands and will follow disposition?: Yes             Copied from CRM #[id number]. Topic: Clinical - Red Word Triage >> Nov 15, 2024 10:45 AM [name] wrote: Red Word that prompted transfer to Nurse Triage: took medication -  made BP go up, got lightheaded. felt sick to the stomach. Reason for Disposition  [1] Caller has URGENT medicine question about med that primary care doctor (or NP/PA) or specialist prescribed AND [2] triager unable to answer question  Answer Assessment - Initial Assessment Questions 1. NAME of MEDICINE: What medicine(s) are you calling about?     Prednisone .  2. QUESTION: What is your question? (e.g., double dose of medicine, side effect)     Side effect. Started medication yesterday 50 mg yesterday and started feeling side effects shortly after taking. She thought it was because she took it on an empty stomach. She took her 50mg  dosage this morning after eating breakfast and states within 30 minutes side effects came back.  3. PRESCRIBER: Who prescribed the medicine? Reason: if prescribed by specialist, call should be referred to that group.     Dr Bluford.  4. SYMPTOMS: Do you have any symptoms? If Yes, ask: What symptoms are you having?  How bad are the symptoms (e.g., mild, moderate, severe)     Nausea, hypertension 145/85 and lightheaded, anxiety and fidgeting. Left arm pain 8/10.  Protocols used: Medication Question Call-A-AH

## 2024-11-15 NOTE — Telephone Encounter (Signed)
 PI

## 2024-12-10 ENCOUNTER — Other Ambulatory Visit (HOSPITAL_COMMUNITY)
Admission: RE | Admit: 2024-12-10 | Discharge: 2024-12-10 | Disposition: A | Discharge status: Home / Self Care | Source: Ambulatory Visit | Attending: Obstetrics & Gynecology | Admitting: Obstetrics & Gynecology

## 2024-12-10 ENCOUNTER — Encounter: Payer: Self-pay | Admitting: Adult Health

## 2024-12-10 ENCOUNTER — Ambulatory Visit: Admitting: Adult Health

## 2024-12-10 VITALS — BP 127/83 | HR 99 | Ht 62.0 in | Wt 211.0 lb

## 2024-12-10 DIAGNOSIS — Z01419 Encounter for gynecological examination (general) (routine) without abnormal findings: Secondary | ICD-10-CM

## 2024-12-10 DIAGNOSIS — R87810 Cervical high risk human papillomavirus (HPV) DNA test positive: Secondary | ICD-10-CM

## 2024-12-10 DIAGNOSIS — Z1151 Encounter for screening for human papillomavirus (HPV): Secondary | ICD-10-CM

## 2024-12-10 DIAGNOSIS — L80 Vitiligo: Secondary | ICD-10-CM

## 2024-12-10 DIAGNOSIS — L293 Anogenital pruritus, unspecified: Secondary | ICD-10-CM

## 2024-12-10 DIAGNOSIS — Z1331 Encounter for screening for depression: Secondary | ICD-10-CM

## 2024-12-10 NOTE — Progress Notes (Signed)
 Patient ID: Emma Pittman, female   DOB: 1961-12-07, 63 y.o.   MRN: [id number] History of Present Illness: Emma Pittman is a 63 year old white female, widowed, PM in for a well woman gyn exam and pap. Her pap last year 11/30/23 was NILM, but +HR HPV and +HPV 18/45. She had a colp 12/27/23 by Dr Jayne no biopsies taken.  PCP is Leita Longs NP    Current Medications, Allergies, Past Medical History, Past Surgical History, Family History and Social History were reviewed in [employer] record.     Review of Systems: Patient denies any headaches, hearing loss, fatigue, blurred vision, shortness of breath, chest pain, abdominal pain, problems with bowel movements, or intercourse(not active). No joint pain or mood swings.  Has some stress UI, has meds from Dr Bertell, she says    Physical Exam:BP 127/83 (BP Location: Right Arm, Patient Position: Sitting, Cuff Size: Large)   Pulse 99   Ht 5' 2 (1.575 m)   Wt 211 lb (95.7 kg)   BMI 38.59 kg/m   General:  Well developed, well nourished, no acute distress Skin:  Warm and dry, has vitiligo  Neck:  Midline trachea, normal thyroid , good ROM, no lymphadenopathy, no carotid bruits heard Lungs; Clear to auscultation bilaterally Breast:  No dominant palpable mass, retraction, or nipple discharge Cardiovascular: Regular rate and rhythm Abdomen:  Soft, non tender, no hepatosplenomegaly Pelvic:  External genitalia is normal in appearance, no lesions, but red and irritated from wearing at pad and walking at [facility].  The vagina is normal in appearance. Urethra has no lesions or masses. The cervix is smooth, pap with HR HPV genotyping performed.  Uterus is felt to be normal size, shape, and contour.  No adnexal masses or tenderness noted.Bladder is non tender, no masses felt. Rectal: Deferred Extremities/musculoskeletal:  No swelling or varicosities noted, no clubbing or cyanosis Psych:  No mood changes, alert and cooperative,seems  happy AA is 0 Fall risk is low    12/10/2024    9:41 AM 11/14/2024    9:15 AM 10/11/2024    2:14 PM  Depression screen PHQ 2/9  Decreased Interest 1 0 0  Down, Depressed, Hopeless 2 0 0  PHQ - 2 Score 3 0 0  Altered sleeping 2 0 0  Tired, decreased energy 1 0 0  Change in appetite 1 0 0  Feeling bad or failure about yourself  0 0 0  Trouble concentrating 0 0 0  Moving slowly or fidgety/restless 0 0 0  Suicidal thoughts 0 0 0  PHQ-9 Score 7 0 0   Difficult doing work/chores  Not difficult at all Not difficult at all     Data saved with a previous flowsheet row definition       12/10/2024    9:41 AM 11/14/2024    9:15 AM 07/25/2024    8:21 AM 11/30/2023   10:15 AM  GAD 7 : Generalized Anxiety Score  Nervous, Anxious, on Edge 1 0 0 0  Control/stop worrying 1 1 1  0  Worry too much - different things 1 1 1  0  Trouble relaxing 0 1 1 0  Restless 1 1 1  0  Easily annoyed or irritable 1 0 0 0  Afraid - awful might happen 0 0 0 0  Total GAD 7 Score 5 4 4  0  Anxiety Difficulty  Not difficult at all Not difficult at all     Upstream - 12/10/24 9066  Pregnancy Intention Screening   Does the patient want to become pregnant in the next year? N/A    Does the patient's partner want to become pregnant in the next year? N/A    Would the patient like to discuss contraceptive options today? N/A      Contraception Wrap Up   Current Method Abstinence;Female Sterilization;Post-Menopause    End Method Abstinence;Female Sterilization;Post-Menopause    Contraception Counseling Provided No         Examination chaperoned by Clarita Salt LPN     Impression and plan: 1. Encounter for gynecological examination with Papanicolaou smear of cervix (Primary) Pap sent Physical in 1 year Labs with PCP Colonoscopy in 2030, had 01/06/2024 Mammogram was negatve 10/29/24 Stay active  - Cytology - PAP( [facility])  2. Vitiligo   3. Perineal irritation Using aquaphor 3N1 cream   4.  Human papillomavirus (HPV) type 18 DNA detected in cervical specimen Pap sent

## 2024-12-12 LAB — CYTOLOGY - PAP
Comment: NEGATIVE
Diagnosis: NEGATIVE
High risk HPV: NEGATIVE

## 2024-12-17 ENCOUNTER — Ambulatory Visit: Payer: Self-pay | Admitting: Adult Health

## 2024-12-19 ENCOUNTER — Other Ambulatory Visit: Payer: Self-pay

## 2024-12-19 DIAGNOSIS — Z6841 Body Mass Index (BMI) 40.0 and over, adult: Secondary | ICD-10-CM

## 2025-01-09 ENCOUNTER — Other Ambulatory Visit: Payer: Self-pay

## 2025-01-09 MED ORDER — ESCITALOPRAM OXALATE 20 MG PO TABS: 20.0000 mg | ORAL_TABLET | Freq: Every day | ORAL | 1 refills | Status: AC

## 2025-01-09 NOTE — Telephone Encounter (Signed)
 Copied from CRM [id number]. Topic: Clinical - Medication Refill >> Jan 09, 2025 11:29 AM Terri G wrote: Medication: escitalopram  (LEXAPRO ) 20 MG tablet  Has the patient contacted their pharmacy? Yes (Agent: If no, request that the patient contact the pharmacy for the refill. If patient does not wish to contact the pharmacy document the reason why and proceed with request.) (Agent: If yes, when and what did the pharmacy advise?)  This is the patient's preferred pharmacy:  [facility] [address], [place] - [address] [place] [address] Phone: [phone] Fax: [phone]  Is this the correct pharmacy for this prescription? Yes If no, delete pharmacy and type the correct one.   Has the prescription been filled recently? No  Is the patient out of the medication? Yes  Has the patient been seen for an appointment in the last year OR does the patient have an upcoming appointment? Yes  Can we respond through MyChart? Yes  Agent: Please be advised that Rx refills may take up to 3 business days. We ask that you follow-up with your pharmacy.

## 2025-01-25 ENCOUNTER — Ambulatory Visit

## 2025-10-15 ENCOUNTER — Ambulatory Visit
